# Patient Record
Sex: Male | Born: 1960 | Race: White | Hispanic: No | Marital: Married | Smoking: Former smoker
Health system: Southern US, Community
[De-identification: ages and names within clinical notes are randomized; demographics above are authoritative.]

## PROBLEM LIST (undated history)

## (undated) DIAGNOSIS — E079 Disorder of thyroid, unspecified: Secondary | ICD-10-CM

## (undated) DIAGNOSIS — C801 Malignant (primary) neoplasm, unspecified: Secondary | ICD-10-CM

## (undated) DIAGNOSIS — I10 Essential (primary) hypertension: Secondary | ICD-10-CM

## (undated) HISTORY — DX: Disorder of thyroid, unspecified: E07.9

---

## 1998-06-27 ENCOUNTER — Ambulatory Visit (HOSPITAL_COMMUNITY): Admission: RE | Admit: 1998-06-27 | Discharge: 1998-06-27 | Payer: Self-pay | Admitting: Family Medicine

## 2009-04-03 ENCOUNTER — Ambulatory Visit: Payer: Self-pay | Admitting: Sports Medicine

## 2009-04-03 DIAGNOSIS — M7660 Achilles tendinitis, unspecified leg: Secondary | ICD-10-CM

## 2009-04-03 DIAGNOSIS — S8410XA Injury of peroneal nerve at lower leg level, unspecified leg, initial encounter: Secondary | ICD-10-CM

## 2009-07-30 ENCOUNTER — Ambulatory Visit: Payer: Self-pay | Admitting: Sports Medicine

## 2010-06-11 NOTE — Assessment & Plan Note (Signed)
Summary: F/U,MC   Vital Signs:  Patient profile:   50 year old male BP sitting:   163 / 88  Vitals Entered By: Lillia Pauls CMA (July 30, 2009 9:39 AM)  History of Present Illness: AT is doing better on RT ran 14.5 miles yesterday some apin immediately after but gone by this AM no swelling feels good in sports insoles with heel lift on RT  on left was getting 5th toe and MT pain lat post resolved this  Physical Exam  General:  Well-developed,well-nourished,in no acute distress; alert,appropriate and cooperative throughout examination Msk:  RT AT minimally tender not swollen  left AT is normal  foot only mild breakdown  mod to high arch  running gait shows forefoot to midfoot strike at slower paces form is good overall and no limp today   Impression & Recommendations:  Problem # 1:  ACHILLES TENDINITIS (ICD-726.71)  This is better  keep up exercises given new pair of sports insoles to help with marathon prep heel lift added on RT  reck if not resolbved after training dec  Orders: Sports Insoles [phone])  Problem # 2:  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

## 2011-10-29 ENCOUNTER — Ambulatory Visit (INDEPENDENT_AMBULATORY_CARE_PROVIDER_SITE_OTHER): Payer: BC Managed Care – PPO | Admitting: Sports Medicine

## 2011-10-29 VITALS — BP 140/78 | Ht 70.0 in | Wt 160.0 lb

## 2011-10-29 DIAGNOSIS — IMO0002 Reserved for concepts with insufficient information to code with codable children: Secondary | ICD-10-CM

## 2011-10-29 DIAGNOSIS — S76319A Strain of muscle, fascia and tendon of the posterior muscle group at thigh level, unspecified thigh, initial encounter: Secondary | ICD-10-CM | POA: Insufficient documentation

## 2011-10-29 DIAGNOSIS — M722 Plantar fascial fibromatosis: Secondary | ICD-10-CM | POA: Insufficient documentation

## 2011-10-29 MED ORDER — MELOXICAM 15 MG PO TABS: ORAL_TABLET | ORAL | Status: DC

## 2011-10-29 NOTE — Assessment & Plan Note (Signed)
Thigh sleeve. HS rehab. Meloxicam. RTC 6 weeks.

## 2011-10-29 NOTE — Progress Notes (Signed)
Patient ID: Gabriel Delgado, male   DOB: Nov 04, 1960, 51 y.o.   MRN: [id number] Subjective:   FA:OZHY hamstring, left foot.  HPI: Left hamstring, pain is been present for several months. Localized mid semimembranosus. No specific rehabilitation, injuries, or medication used for this. Pain is localized as above, no radiation, sharp in nature particularly when firing the hamstrings.  Left heel pain: Present for 4 weeks, worse in the morning, no radiation, no specific rehabilitation done for this. Worse with weightbearing, and with running.  Currently at 40 miles per week.  Past medical history, surgical history, family history, social history, allergies, and medications reviewed from the medical record and no changes needed.  Review of Systems: No fevers, chills, night sweats, weight loss, chest pain, or shortness of breath.    Objective:  General:  Well Developed, well nourished, and in no acute distress. Neuro:  Alert and oriented x3, extra-ocular muscles intact. Skin: Warm and dry, no rashes noted. Respiratory:  Not using accessory muscles, speaking in full sentences. Musculoskeletal: To palpation mid semimembranosus and the left side. Reproduction of pain with resisted knee flexion.  Tender to palpation left calcaneal insertion of the plantar fascia. Pes cavus bilaterally. Leg lengths even, hip abductor strength even.  Sports insoles with heel cups placed. Running gait is neutral.  Assessment & Plan:

## 2011-10-29 NOTE — Assessment & Plan Note (Signed)
Sports insoles. Heel cups. HEP. Meloxicam. RTC 6 weeks, injection prn.

## 2011-12-10 ENCOUNTER — Ambulatory Visit (INDEPENDENT_AMBULATORY_CARE_PROVIDER_SITE_OTHER): Payer: BC Managed Care – PPO | Admitting: Sports Medicine

## 2011-12-10 VITALS — BP 149/85

## 2011-12-10 DIAGNOSIS — M79673 Pain in unspecified foot: Secondary | ICD-10-CM | POA: Insufficient documentation

## 2011-12-10 DIAGNOSIS — M79609 Pain in unspecified limb: Secondary | ICD-10-CM

## 2011-12-10 DIAGNOSIS — M722 Plantar fascial fibromatosis: Secondary | ICD-10-CM

## 2011-12-10 NOTE — Progress Notes (Signed)
The patient is here for followup of his plantar fasciitis. This is on his left foot. Patient states that it has improved but he still has significant pain after a long runs. Patient is able to do his 2-3 miles during the week when he tries to go on greater than 5 miles he has significant pain at the end of the run. Patient states that it's more in the midfoot hindfoot. Patient denies having any swelling or numbness in the area. Patient is taking the anti-inflammatories we gave him previously. Patient is also wearing a sports insoles which he does think has helped. Patient though would only say he is about 40-50% better.  Review of systems as related to the chief complaint is negative.  Physical exam: Normal inspection with no visable or palpable fat pad atrophy and no visible swelling/erythema. Patient does have high longitudinal arches bilaterally. Patient is not tender at medial insertion of plantar fascia into calcaneus. Patient is significantly more tender over the midfoot. Great toe motion: Full Patient's transverse arches are normal.  Ultrasound today showed that patient does have thickening of his plantar fasciitis at the insertion of the calcaneus measured at 0.60 cm. Patient also has a nodule of 0.38 approximately 2 cm distally from the insertion. Patient is most tender in the midfoot right over what appears to be a varicose vein that is significant length and appears to go potentially within the muscle belly. No signs of infection no signs of fracture.

## 2011-12-10 NOTE — Patient Instructions (Signed)
Good to see you Wear the arch supports Put the scaphoid pads in you other shoes Come back in 3 weeks with appointment with Dr. Katrinka Blazing to make orthotics.

## 2011-12-10 NOTE — Assessment & Plan Note (Addendum)
Patient still has some thickening on ultrasound but is not having any pain. Patient appears to be doing better overall with the plantar fasciitis but found to have a potential varicose vein that is contributing to his running foot pain. Encourage patient to continue to do exercises but he can stop the meloxicam at this point as we do not see much inflammation. Patient also given scaphoid pad and arch strap to help support the arch. In addition to that patient was given arch straps. Patient will continue to do this will followup in 3 weeks and if show improvement we'll have custom orthotics made.

## 2011-12-10 NOTE — Assessment & Plan Note (Signed)
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 Patient's high arch is likely contributing to this as well as plantar fasciitis. We will support the arch with a scaphoid pad today and he was given extra is to put in his other shoes. Patient also given arch trap to try. Patient will followup in 3 weeks' time when he will have custom orthotics made he'll help with this problem. Patient will discontinue his meloxicam. Patient will come back in 3 weeks.

## 2011-12-30 ENCOUNTER — Ambulatory Visit (INDEPENDENT_AMBULATORY_CARE_PROVIDER_SITE_OTHER): Payer: BC Managed Care – PPO | Admitting: Family Medicine

## 2011-12-30 ENCOUNTER — Encounter: Payer: Self-pay | Admitting: Family Medicine

## 2011-12-30 VITALS — BP 133/84 | HR 49 | Ht 69.0 in | Wt 160.0 lb

## 2011-12-30 DIAGNOSIS — M79673 Pain in unspecified foot: Secondary | ICD-10-CM

## 2011-12-30 DIAGNOSIS — M722 Plantar fascial fibromatosis: Secondary | ICD-10-CM

## 2011-12-30 DIAGNOSIS — M79609 Pain in unspecified limb: Secondary | ICD-10-CM

## 2011-12-30 NOTE — Assessment & Plan Note (Signed)
Patient then was placed in orthotics today. Patient is going to try these custom orthotics and followup as needed. Patient is a can return at any point for any changes that would be necessary. Patient was able to ambulate and run in a very neutral position without significant pain. Encourage patient to increase his activity slowly in knees orthotics and likely will adjust to him over the course of the next 2 weeks.

## 2011-12-30 NOTE — Progress Notes (Signed)
Patient is here for followup of his left foot pain. Patient was treated previously for plantar fasciitis and was found to have a varicose vein that transverse the plantar aspect of his foot musculature that was inflamed.patient has been wearing sports insoles and has had some minimal improvement since that time. Patient is here for custom orthotics which hopefully will be of greater benefit. Patient is continuing with icing regimen but has not been using any anti-inflammatories at this time. Patient has been able to run still running up to 8 miles at one time. Patient is planning on hopefully running 50 miles this weekend.  Review of systems as related to the chief complaint is negative.  Physical exam: Normal inspection with no visable or palpable fat pad atrophy and no visible swelling/erythema. Patient does have high longitudinal arches bilaterally. Patient is not tender at medial insertion of plantar fascia into calcaneus. Patient is significantly more tender over the midfoot. Great toe motion: Full Patient's transverse arches are normal.  Patient was fitted for a : standard, cushioned, semi-rigid orthotic. The orthotic was heated and afterward the patient stood on the orthotic blank positioned on the orthotic stand. The patient was positioned in subtalar neutral position and 10 degrees of ankle dorsiflexion in a weight bearing stance. After completion of molding, a stable base was applied to the orthotic blank. The blank was ground to a stable position for weight bearing. Size:11 Base:EVA Posting:none Additional orthotic padding:none

## 2012-01-01 ENCOUNTER — Telehealth: Payer: Self-pay | Admitting: *Deleted

## 2012-01-01 NOTE — Telephone Encounter (Signed)
Pt called stating he ran 6 miles without new orthotics on tues and had "excruiating" pain after run- was unable to bear weight.  Still very painful after 2 days of rest.  Per Dr. Margaretha Sheffield scheduled pt for appt with Dr. Jennette Kettle tomorrow.

## 2012-01-02 ENCOUNTER — Ambulatory Visit (INDEPENDENT_AMBULATORY_CARE_PROVIDER_SITE_OTHER): Payer: BC Managed Care – PPO | Admitting: Family Medicine

## 2012-01-02 ENCOUNTER — Encounter: Payer: Self-pay | Admitting: Family Medicine

## 2012-01-02 VITALS — BP 153/88 | HR 56 | Ht 70.0 in | Wt 160.0 lb

## 2012-01-02 DIAGNOSIS — M722 Plantar fascial fibromatosis: Secondary | ICD-10-CM

## 2012-01-02 MED ORDER — NITROGLYCERIN 0.2 MG/HR TD PT24: MEDICATED_PATCH | TRANSDERMAL | Status: DC

## 2012-01-02 NOTE — Patient Instructions (Signed)
DR DAVID STAUFFER MON OCT 7TH AT 2PM [address] [place] [address] [phone] PT IS ON WAITING LIST TO BE CALLED WITH CANCELLATIONS OCCUR

## 2012-01-02 NOTE — Progress Notes (Signed)
Patient ID: Gabriel Delgado, male   DOB: 31-May-1960, 51 y.o.   MRN: [id number] Patient here for worsening of his left plan fasciitis. He had seen Dr. Katrinka Blazing last week he may be impaired orthotics. The patient ran 6 miles without the orthotics because he had been told to break them in generally. After the 6 mile run he had severe pain which has continued until today. He has not been able to run since. He weight bears with extreme pain. Has not noted any redness or swelling in he said no fever.        OBJECTIVE: Origin the plantar fascia on the left foot is tender to palpation. He is quite prominent plantar fascia bilaterally. Imaging review: Elective his ultrasound images. 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 There is some disarray of the fibers and they appeared to taking a lot of somewhat circular pattern. ASSESSMENT/ PLAN: Left plan fasciitis that I think maybe compounded by the development of a fibroma within the plantar fascia.. If there truly is a fibroma there,  then his prognosis is not as good;  we discussed at some length today. I would like to get evaluation of podiatrist and have scheduled him to see Dr. San Jetty. We can't get him in there for about a month. In the interim we will try for some pain relief with topical nitro glycerin patch. I would be hesitant to do a corticosteroid injection at this time until I get further evaluation. He is comfortable with this plan. Should he have worsening symptoms in the interim he'll let me know.

## 2012-08-13 ENCOUNTER — Other Ambulatory Visit: Payer: Self-pay | Admitting: Emergency Medicine

## 2012-08-13 DIAGNOSIS — R0989 Other specified symptoms and signs involving the circulatory and respiratory systems: Secondary | ICD-10-CM

## 2012-08-18 ENCOUNTER — Ambulatory Visit
Admission: RE | Admit: 2012-08-18 | Discharge: 2012-08-18 | Disposition: A | Discharge status: Home / Self Care | Payer: BC Managed Care – PPO | Source: Ambulatory Visit | Attending: Emergency Medicine | Admitting: Emergency Medicine

## 2012-08-18 DIAGNOSIS — R0989 Other specified symptoms and signs involving the circulatory and respiratory systems: Secondary | ICD-10-CM

## 2012-08-20 ENCOUNTER — Other Ambulatory Visit: Payer: Self-pay | Admitting: Emergency Medicine

## 2012-08-20 ENCOUNTER — Ambulatory Visit
Admission: RE | Admit: 2012-08-20 | Discharge: 2012-08-20 | Disposition: A | Discharge status: Home / Self Care | Payer: BC Managed Care – PPO | Source: Ambulatory Visit | Attending: Emergency Medicine | Admitting: Emergency Medicine

## 2012-08-20 DIAGNOSIS — R109 Unspecified abdominal pain: Secondary | ICD-10-CM

## 2012-08-20 MED ORDER — IOHEXOL 300 MG/ML  SOLN
100.0000 mL | Freq: Once | INTRAMUSCULAR | Status: AC | PRN
  Administered 2012-08-20: 100 mL via INTRAVENOUS

## 2013-02-03 ENCOUNTER — Encounter: Payer: Self-pay | Admitting: Family Medicine

## 2013-09-27 ENCOUNTER — Encounter: Payer: Self-pay | Admitting: Internal Medicine

## 2013-11-09 ENCOUNTER — Ambulatory Visit (AMBULATORY_SURGERY_CENTER): Payer: Self-pay | Admitting: *Deleted

## 2013-11-09 VITALS — Ht 71.0 in | Wt 175.0 lb

## 2013-11-09 DIAGNOSIS — Z1211 Encounter for screening for malignant neoplasm of colon: Secondary | ICD-10-CM

## 2013-11-09 MED ORDER — MOVIPREP 100 G PO SOLR: ORAL | Status: DC

## 2013-11-09 NOTE — Progress Notes (Signed)
No egg or soy allergy  No anesthesia or intubation problems per pt  No diet medications taken  Registered in EMMI   

## 2013-11-10 ENCOUNTER — Encounter: Payer: Self-pay | Admitting: Internal Medicine

## 2013-11-16 ENCOUNTER — Encounter: Payer: BC Managed Care – PPO | Admitting: Internal Medicine

## 2013-12-21 ENCOUNTER — Ambulatory Visit (AMBULATORY_SURGERY_CENTER): Payer: BC Managed Care – PPO | Admitting: Internal Medicine

## 2013-12-21 ENCOUNTER — Encounter: Payer: Self-pay | Admitting: Internal Medicine

## 2013-12-21 VITALS — BP 108/74 | HR 56 | Temp 98.2°F | Resp 19 | Ht 71.0 in | Wt 175.0 lb

## 2013-12-21 DIAGNOSIS — D126 Benign neoplasm of colon, unspecified: Secondary | ICD-10-CM

## 2013-12-21 DIAGNOSIS — Z1211 Encounter for screening for malignant neoplasm of colon: Secondary | ICD-10-CM

## 2013-12-21 MED ORDER — SODIUM CHLORIDE 0.9 % IV SOLN: 500.0000 mL | INTRAVENOUS | Status: DC

## 2013-12-21 NOTE — Op Note (Signed)
[facility]  [address] [place], [address]   COLONOSCOPY PROCEDURE REPORT  PATIENT: Gabriel Delgado, Gabriel Delgado.  MR#: [id number] BIRTHDATE: Oct 08, 1960 , 52  yrs. old GENDER: Male ENDOSCOPIST: Jerene Bears, MD REFERRED TK:PTWSF Kindl, M.D. PROCEDURE DATE:  12/21/2013 PROCEDURE:   Colonoscopy with snare polypectomy First Screening Colonoscopy - Avg.  risk and is 50 yrs.  old or older Yes.  Prior Negative Screening - Now for repeat screening. N/A  History of Adenoma - Now for follow-up colonoscopy & has been > or = to 3 yrs.  N/A  Polyps Removed Today? Yes. ASA CLASS:   Class II INDICATIONS:average risk screening and first colonoscopy. MEDICATIONS: MAC sedation, administered by CRNA, Propofol (Diprivan), and propofol (Diprivan) 500mg  IV  DESCRIPTION OF PROCEDURE:   After the risks benefits and alternatives of the procedure were thoroughly explained, informed consent was obtained.  A digital rectal exam revealed no rectal mass.   The LB [id number]  endoscope was introduced through the anus and advanced to the cecum, which was identified by both the appendix and ileocecal valve. No adverse events experienced. The quality of the prep was good, using MoviPrep  The instrument was then slowly withdrawn as the colon was fully examined.  COLON FINDINGS: A semi-pedunculated polyp measuring 6 mm in size was found in the sigmoid colon.  A polypectomy was performed with a cold snare.  The resection was complete and the polyp tissue was completely retrieved.   There was moderate diverticulosis noted in the transverse colon, descending colon, and sigmoid colon with associated muscular hypertrophy.  Retroflexed views revealed internal hemorrhoids. The time to cecum=4 minutes 23 seconds. Withdrawal time=13 minutes 17 seconds.  The scope was withdrawn and the procedure completed. COMPLICATIONS: There were no complications.  ENDOSCOPIC IMPRESSION: 1.   Semi-pedunculated polyp  measuring 6 mm in size was found in the sigmoid colon; polypectomy was performed with a cold snare 2.   There was moderate diverticulosis noted in the transverse colon, descending colon, and sigmoid colon  RECOMMENDATIONS: 1.  Hold aspirin, aspirin products, and anti-inflammatory medication for 1 week. 2.  Await pathology results 3.  High fiber diet 4.  If the polyp removed today is proven to be an adenomatous (pre-cancerous) polyp, you will need a repeat colonoscopy in 5 years.  Otherwise you should continue to follow colorectal cancer screening guidelines for "routine risk" patients with colonoscopy in 10 years.  You will receive a letter within 1-2 weeks with the results of your biopsy as well as final recommendations.  Please call my office if you have not received a letter after 3 weeks.   eSigned:  Jerene Bears, MD 12/21/2013 10:55 AM   cc: The Patient and Ihor Gully, MD   PATIENT NAME:  Gabriel Delgado, Gabriel Delgado. MR#: [id number]

## 2013-12-21 NOTE — Progress Notes (Signed)
Called to room to assist during endoscopic procedure.  Patient ID and intended procedure confirmed with present staff. Received instructions for my participation in the procedure from the performing physician.  

## 2013-12-21 NOTE — Progress Notes (Signed)
Report to PACU, RN, vss, BBS= Clear.  

## 2013-12-21 NOTE — Patient Instructions (Signed)
Discharge instructions given with verbal understanding. Handouts on polyps and diverticulosis. Resume previous medications. YOU HAD AN ENDOSCOPIC PROCEDURE TODAY AT THE [facility]: Refer to the procedure report that was given to you for any specific questions about what was found during the examination.  If the procedure report does not answer your questions, please call your gastroenterologist to clarify.  If you requested that your care partner not be given the details of your procedure findings, then the procedure report has been included in a sealed envelope for you to review at your convenience later.  YOU SHOULD EXPECT: Some feelings of bloating in the abdomen. Passage of more gas than usual.  Walking can help get rid of the air that was put into your GI tract during the procedure and reduce the bloating. If you had a lower endoscopy (such as a colonoscopy or flexible sigmoidoscopy) you may notice spotting of blood in your stool or on the toilet paper. If you underwent a bowel prep for your procedure, then you may not have a normal bowel movement for a few days.  DIET: Your first meal following the procedure should be a light meal and then it is ok to progress to your normal diet.  A half-sandwich or bowl of soup is an example of a good first meal.  Heavy or fried foods are harder to digest and may make you feel nauseous or bloated.  Likewise meals heavy in dairy and vegetables can cause extra gas to form and this can also increase the bloating.  Drink plenty of fluids but you should avoid alcoholic beverages for 24 hours.  ACTIVITY: Your care partner should take you home directly after the procedure.  You should plan to take it easy, moving slowly for the rest of the day.  You can resume normal activity the day after the procedure however you should NOT DRIVE or use heavy machinery for 24 hours (because of the sedation medicines used during the test).    SYMPTOMS TO REPORT  IMMEDIATELY: A gastroenterologist can be reached at any hour.  During normal business hours, 8:30 AM to 5:00 PM Monday through Friday, call [phone].  After hours and on weekends, please call the GI answering service at [phone] who will take a message and have the physician on call contact you.   Following lower endoscopy (colonoscopy or flexible sigmoidoscopy):  Excessive amounts of blood in the stool  Significant tenderness or worsening of abdominal pains  Swelling of the abdomen that is new, acute  Fever of 100F or higher  FOLLOW UP: If any biopsies were taken you will be contacted by phone or by letter within the next 1-3 weeks.  Call your gastroenterologist if you have not heard about the biopsies in 3 weeks.  Our staff will call the home number listed on your records the next business day following your procedure to check on you and address any questions or concerns that you may have at that time regarding the information given to you following your procedure. This is a courtesy call and so if there is no answer at the home number and we have not heard from you through the emergency physician on call, we will assume that you have returned to your regular daily activities without incident.  SIGNATURES/CONFIDENTIALITY: You and/or your care partner have signed paperwork which will be entered into your electronic medical record.  These signatures attest to the fact that that the information above on your After Visit Summary   has been reviewed and is understood.  Full responsibility of the confidentiality of this discharge information lies with you and/or your care-partner. 

## 2013-12-23 ENCOUNTER — Telehealth: Payer: Self-pay | Admitting: *Deleted

## 2013-12-23 NOTE — Telephone Encounter (Signed)
  Follow up Call-  Call back number 12/21/2013  Post procedure Call Back phone  # [phone]  Permission to leave phone message Yes     Patient questions:  Do you have a fever, pain , or abdominal swelling? No. Pain Score  0 *  Have you tolerated food without any problems? Yes.    Have you been able to return to your normal activities? Yes.    Do you have any questions about your discharge instructions: Diet   No. Medications  No. Follow up visit  No.  Do you have questions or concerns about your Care? No.  Actions: * If pain score is 4 or above: No action needed, pain <4.

## 2013-12-26 ENCOUNTER — Encounter: Payer: Self-pay | Admitting: Internal Medicine

## 2015-03-01 ENCOUNTER — Encounter (HOSPITAL_COMMUNITY): Payer: Self-pay | Admitting: Emergency Medicine

## 2015-03-01 ENCOUNTER — Emergency Department (HOSPITAL_COMMUNITY)
Admission: EM | Admit: 2015-03-01 | Discharge: 2015-03-01 | Disposition: A | Discharge status: Home / Self Care | Payer: BLUE CROSS/BLUE SHIELD | Attending: Emergency Medicine | Admitting: Emergency Medicine

## 2015-03-01 ENCOUNTER — Other Ambulatory Visit (HOSPITAL_COMMUNITY): Payer: Self-pay | Admitting: Family Medicine

## 2015-03-01 ENCOUNTER — Ambulatory Visit (EMERGENCY_DEPARTMENT_HOSPITAL)
Admission: RE | Admit: 2015-03-01 | Discharge: 2015-03-01 | Disposition: A | Discharge status: Home / Self Care | Payer: BLUE CROSS/BLUE SHIELD | Source: Ambulatory Visit | Attending: Family Medicine | Admitting: Family Medicine

## 2015-03-01 DIAGNOSIS — R52 Pain, unspecified: Secondary | ICD-10-CM | POA: Diagnosis not present

## 2015-03-01 DIAGNOSIS — I82401 Acute embolism and thrombosis of unspecified deep veins of right lower extremity: Secondary | ICD-10-CM | POA: Insufficient documentation

## 2015-03-01 DIAGNOSIS — I82441 Acute embolism and thrombosis of right tibial vein: Secondary | ICD-10-CM

## 2015-03-01 LAB — COMPREHENSIVE METABOLIC PANEL
ALT: 29 U/L (ref 17–63)
AST: 32 U/L (ref 15–41)
Albumin: 4.1 g/dL (ref 3.5–5.0)
Alkaline Phosphatase: 60 U/L (ref 38–126)
Anion gap: 8 (ref 5–15)
BUN: 16 mg/dL (ref 6–20)
CO2: 28 mmol/L (ref 22–32)
Calcium: 9.3 mg/dL (ref 8.9–10.3)
Chloride: 101 mmol/L (ref 101–111)
Creatinine, Ser: 0.95 mg/dL (ref 0.61–1.24)
GFR calc Af Amer: >60 mL/min (ref >60)
GFR calc non Af Amer: >60 mL/min (ref >60)
Glucose, Bld: 95 mg/dL (ref 65–99)
Potassium: 4 mmol/L (ref 3.5–5.1)
Sodium: 137 mmol/L (ref 135–145)
Total Bilirubin: 0.4 mg/dL (ref 0.3–1.2)
Total Protein: 7.4 g/dL (ref 6.5–8.1)

## 2015-03-01 LAB — CBC WITH DIFFERENTIAL/PLATELET
Basophils Absolute: 0 10*3/uL (ref 0.0–0.1)
Basophils Relative: 1 %
Eosinophils Absolute: 0.4 10*3/uL (ref 0.0–0.7)
Eosinophils Relative: 6 %
HCT: 46 % (ref 39.0–52.0)
Hemoglobin: 15.4 g/dL (ref 13.0–17.0)
Lymphocytes Relative: 30 %
Lymphs Abs: 2 10*3/uL (ref 0.7–4.0)
MCH: 31 pg (ref 26.0–34.0)
MCHC: 33.5 g/dL (ref 30.0–36.0)
MCV: 92.7 fL (ref 78.0–100.0)
Monocytes Absolute: 0.6 10*3/uL (ref 0.1–1.0)
Monocytes Relative: 9 %
Neutro Abs: 3.7 10*3/uL (ref 1.7–7.7)
Neutrophils Relative %: 54 %
Platelets: 234 10*3/uL (ref 150–400)
RBC: 4.96 MIL/uL (ref 4.22–5.81)
RDW: 12.9 % (ref 11.5–15.5)
WBC: 6.7 10*3/uL (ref 4.0–10.5)

## 2015-03-01 LAB — URINALYSIS, ROUTINE W REFLEX MICROSCOPIC
Bilirubin Urine: NEGATIVE
Glucose, UA: NEGATIVE mg/dL
Hgb urine dipstick: NEGATIVE
Ketones, ur: NEGATIVE mg/dL
Leukocytes, UA: NEGATIVE
Nitrite: NEGATIVE
Protein, ur: NEGATIVE mg/dL
Specific Gravity, Urine: 1.018 (ref 1.005–1.030)
Urobilinogen, UA: 0.2 mg/dL (ref 0.0–1.0)
pH: 5 (ref 5.0–8.0)

## 2015-03-01 LAB — PROTIME-INR
INR: 1.06 (ref 0.00–1.49)
Prothrombin Time: 14 seconds (ref 11.6–15.2)

## 2015-03-01 MED ORDER — RIVAROXABAN 15 MG PO TABS
15.0000 mg | ORAL_TABLET | Freq: Two times a day (BID) | ORAL | Status: DC
  Administered 2015-03-01: 15 mg via ORAL
  Filled 2015-03-01 (×2): qty 1

## 2015-03-01 MED ORDER — RIVAROXABAN 15 MG PO TABS: 15.0000 mg | ORAL_TABLET | Freq: Two times a day (BID) | ORAL | Status: AC

## 2015-03-01 MED ORDER — RIVAROXABAN 15 MG PO TABS: 15.0000 mg | ORAL_TABLET | Freq: Two times a day (BID) | ORAL | Status: DC

## 2015-03-01 MED ORDER — RIVAROXABAN (XARELTO) EDUCATION KIT FOR DVT/PE PATIENTS
PACK | Freq: Once | Status: DC
  Filled 2015-03-01: qty 1

## 2015-03-01 MED ORDER — RIVAROXABAN 20 MG PO TABS: 20.0000 mg | ORAL_TABLET | Freq: Every day | ORAL | Status: AC

## 2015-03-01 MED ORDER — RIVAROXABAN 20 MG PO TABS: 20.0000 mg | ORAL_TABLET | Freq: Every day | ORAL | Status: DC

## 2015-03-01 NOTE — ED Provider Notes (Signed)
CSN: [id number]     Arrival date & time 03/01/15  1615 History  By signing my name below, I, Irene Pap, attest that this documentation has been prepared under the direction and in the presence of [employer], PA-C. Electronically Signed: Irene Pap, ED Scribe. 03/01/2015. 10:24 PM.   Chief Complaint  Patient presents with  . DVT   The history is provided by the patient. No language interpreter was used.   HPI Comments: KALLIN HENK is a 54 y.o. Male with a hx of thyroid disease who presents to the Emergency Department complaining of right calf pain and DVT follow up one hour ago. Pt states that he ran a half marathon this past weekend which caused right calf pain and swelling. He states that he saw his orthopedic doctor, Dr. Rip Harbour, who sent him to the ED for a DVT work up and his study came back positive. He denies fever, chills, cough, chest pain or SOB. He denies hx of DVT, hematological problems, or cancer. Pt states that he recently ran 68 miles prior to his marathon and recently traveled to [place].   Past Medical History  Diagnosis Date  . Thyroid disease     hypothryoid   History reviewed. No pertinent past surgical history. Family History  Problem Relation Age of Onset  . Colon cancer Neg Hx   . Esophageal cancer Neg Hx   . Rectal cancer Neg Hx   . Stomach cancer Neg Hx   . Colon polyps Father    Social History  Substance Use Topics  . Smoking status: Former Smoker    Quit date: 05/12/1997  . Smokeless tobacco: Never Used  . Alcohol Use: Yes     Comment: few beers on weekend    Review of Systems  All other systems reviewed and are negative.  Allergies  Review of patient's allergies indicates no known allergies.  Home Medications   Prior to Admission medications   Medication Sig Start Date End Date Taking? Authorizing Provider  ALPRAZolam Duanne Moron) 0.5 MG tablet Take 0.5 mg by mouth as needed. 12/23/11   Historical Provider, MD  Brimonidine  Tartrate (MIRVASO) 0.33 % GEL Apply topically daily.    Historical Provider, MD  levothyroxine (SYNTHROID, LEVOTHROID) 50 MCG tablet Take 50 mcg by mouth daily before breakfast.    Historical Provider, MD  metroNIDAZOLE (METROGEL) 1 % gel Apply topically daily.    Historical Provider, MD  Rivaroxaban (XARELTO) 15 MG TABS tablet Take 1 tablet (15 mg total) by mouth 2 (two) times daily. 03/01/15   Okey Regal, PA-C  rivaroxaban (XARELTO) 20 MG TABS tablet Take 1 tablet (20 mg total) by mouth daily with supper. 03/23/15   Apolonia Ellwood, PA-C   BP 169/93 mmHg  Pulse 65  Temp(Src) 98.2 F (36.8 C) (Oral)  Resp 20  SpO2 97%  Physical Exam  Constitutional: He is oriented to person, place, and time. He appears well-developed and well-nourished.  HENT:  Head: Normocephalic and atraumatic.  Right Ear: External ear normal.  Left Ear: External ear normal.  Nose: Nose normal.  Mouth/Throat: Oropharynx is clear and moist. No oropharyngeal exudate.  Eyes: Conjunctivae and EOM are normal. Pupils are equal, round, and reactive to light.  Neck: Normal range of motion. Neck supple.  Cardiovascular: Normal rate, regular rhythm, normal heart sounds and intact distal pulses.  Exam reveals no gallop and no friction rub.   No murmur heard. Pulmonary/Chest: Effort normal and breath sounds normal. No respiratory distress. He has no  wheezes. He has no rales. He exhibits no tenderness.  Musculoskeletal: Normal range of motion.  Extremities equal bilaterally; no signs of swelling or edema; minor tenderness to the right calf; pedal pulses 2+  Neurological: He is alert and oriented to person, place, and time. He has normal reflexes. He displays normal reflexes. No cranial nerve deficit. He exhibits normal muscle tone. Coordination normal.  Skin: Skin is warm and dry.  Psychiatric: He has a normal mood and affect. His behavior is normal.  Nursing note and vitals reviewed.  ED Course  Procedures (including  critical care time) DIAGNOSTIC STUDIES: Oxygen Saturation is 97% on RA, normal by my interpretation.    COORDINATION OF CARE: 5:17 PM-Discussed treatment plan which includes labs, UA, anti-coagulant and follow up precautions with pt at bedside and pt agreed to plan.   Labs Review Labs Reviewed  CBC WITH DIFFERENTIAL/PLATELET  PROTIME-INR  COMPREHENSIVE METABOLIC PANEL  URINALYSIS, ROUTINE W REFLEX MICROSCOPIC (NOT AT [facility])   Imaging Review No results found. I have personally reviewed and evaluated these images and lab results as part of my medical decision-making.   EKG Interpretation None      MDM   Final diagnoses:  Deep vein thrombosis (DVT) of tibial vein of right lower extremity, unspecified chronicity (HCC)   Labs:  Labs Reviewed  CBC WITH DIFFERENTIAL/PLATELET  PROTIME-INR  COMPREHENSIVE METABOLIC PANEL  URINALYSIS, ROUTINE W REFLEX MICROSCOPIC (NOT AT [facility])    Imaging:  Consults:  Therapeutics: Xarelto- 15 mg  Discharge Meds: Xarelto  Assessment/Plan: 54 y.o. Male who presents to the ED after resulting with a positive DVT study. Pt was sent to the ED by his orthopedic doctor to have the DVT work up after presenting with right calf pain and swelling from running a half marathon this past weekend. Will perform blood work and UA. Discussed treatment options with pt which includes the use of anti-coagulants and how to proceed with treatment with PCP. Will have pharmacy discuss anti-coagulant options with pt. after discussing risks and benefits with the patient patient agreed to anticoagulation therapy. Patient received the first dose here in the ED, with instructions for continuing management. Patient is instructed follow-up with his primary care on Monday for reevaluation and discussion of duration of therapy. Patient was given strict return precautions by both myself and the pharmacist. He verbalizes understanding and agreement for today's plan and had no further  questions or concerns at time of discharge.  I personally performed the services described in this documentation, which was scribed in my presence. The recorded information has been reviewed and is accurate.   Okey Regal, PA-C 03/01/15 [place], MD 03/01/15 [phone]

## 2015-03-01 NOTE — Progress Notes (Signed)
ANTICOAGULATION CONSULT NOTE - Initial Consult  Pharmacy Consult for Xarelto Indication: DVT  No Known Allergies  Patient Measurements: Height: 5\' 10"  (177.8 cm) (pt reported) Weight: 173 lb (78.472 kg) (pt reported) IBW/kg (Calculated) : 73  Vital Signs: Temp: 98.2 F (36.8 C) (10/20 1633) Temp Source: Oral (10/20 1633) BP: 169/93 mmHg (10/20 1633) Pulse Rate: 65 (10/20 1633)  Labs:  Recent Labs  03/01/15 1745  HGB 15.4  HCT 46.0  PLT 234  LABPROT 14.0  INR 1.06  CREATININE 0.95    Estimated Creatinine Clearance: 91.8 mL/min (by C-G formula based on Cr of 0.95).   Medical History: Past Medical History  Diagnosis Date  . Thyroid disease     hypothryoid    Medications:   (Not in a hospital admission)  Assessment: 55 yoM w/ DVT of right leg after running marathon this past weekend. PMH of hypothyroidism. To be initiated on Xarelto per pharmacy.  Hgb 15.4, Plt 234, SCr 0.95, CrCl ~92  Goal of Therapy:  Therapeutic anticoagulation Monitor platelets by anticoagulation protocol: Yes   Plan:  Xarelto 15 mg BID for 21 days, then 20 mg daily  F/u with PCP who will determine length of treatment Patient and wife educated on 03/01/15.  Governor Specking, PharmD Clinical Pharmacy Resident Pager: [phone] 03/01/2015,7:43 PM

## 2015-03-01 NOTE — Progress Notes (Signed)
*  Preliminary Results* Right lower extremity venous duplex completed. Right lower extremity is positive for deep vein thrombosis involving a small segment of a single right posterior tibial vein. There is no evidence of right Baker's cyst.  03/01/2015 4:02 PM  Maudry Mayhew, RVT, RDCS, RDMS

## 2015-03-01 NOTE — ED Notes (Signed)
Pt c/o R calf pain since running marathon on Saturday. Sent here today for dvt study which was positive. They then sent him here. Pt denies cp/sob.

## 2015-03-01 NOTE — Discharge Instructions (Signed)
Deep Vein Thrombosis °A deep vein thrombosis (DVT) is a blood clot (thrombus) that usually occurs in a deep, larger vein of the lower leg or the pelvis, or in an upper extremity such as the arm. These are dangerous and can lead to serious and even life-threatening complications if the clot travels to the lungs. °A DVT can damage the valves in your leg veins so that instead of flowing upward, the blood pools in the lower leg. This is called post-thrombotic syndrome, and it can result in pain, swelling, discoloration, and sores on the leg. °CAUSES °A DVT is caused by the formation of a blood clot in your leg, pelvis, or arm. Usually, several things contribute to the formation of blood clots. A clot may develop when: °· Your blood flow slows down. °· Your vein becomes damaged in some way. °· You have a condition that makes your blood clot more easily. °RISK FACTORS °A DVT is more likely to develop in: °· People who are older, especially over 60 years of age. °· People who are overweight (obese). °· People who sit or lie still for a long time, such as during long-distance travel (over 4 hours), bed rest, hospitalization, or during recovery from certain medical conditions like a stroke. °· People who do not engage in much physical activity (sedentary lifestyle). °· People who have chronic breathing disorders. °· People who have a personal or family history of blood clots or blood clotting disease. °· People who have peripheral vascular disease (PVD), diabetes, or some types of cancer. °· People who have heart disease, especially if the person had a recent heart attack or has congestive heart failure. °· People who have neurological diseases that affect the legs (leg paresis). °· People who have had a traumatic injury, such as breaking a hip or leg. °· People who have recently had major or lengthy surgery, especially on the hip, knee, or abdomen. °· People who have had a central line placed inside a large vein. °· People  who take medicines that contain the hormone estrogen. These include birth control pills and hormone replacement therapy. °· Pregnancy or during childbirth or the postpartum period. °· Long plane flights (over 8 hours). °SIGNS AND SYMPTOMS °Symptoms of a DVT can include:  °· Swelling of your leg or arm, especially if one side is much worse. °· Warmth and redness of your leg or arm, especially if one side is much worse. °· Pain in your arm or leg. If the clot is in your leg, symptoms may be more noticeable or worse when you stand or walk. °· A feeling of pins and needles, if the clot is in the arm. °The symptoms of a DVT that has traveled to the lungs (pulmonary embolism, PE) usually start suddenly and include: °· Shortness of breath while active or at rest. °· Coughing or coughing up blood or blood-tinged mucus. °· Chest pain that is often worse with deep breaths. °· Rapid or irregular heartbeat. °· Feeling light-headed or dizzy. °· Fainting. °· Feeling anxious. °· Sweating. °There may also be pain and swelling in a leg if that is where the blood clot started. °These symptoms may represent a serious problem that is an emergency. Do not wait to see if the symptoms will go away. Get medical help right away. Call your local emergency services (911 in the [place] Do not drive yourself to the hospital. °DIAGNOSIS °Your health care provider will take a medical history and perform a physical exam. You may also   have other tests, including: °· Blood tests to assess the clotting properties of your blood. °· Imaging tests, such as CT, ultrasound, MRI, X-ray, and other tests to see if you have clots anywhere in your body. °TREATMENT °After a DVT is identified, it can be treated. The type of treatment that you receive depends on many factors, such as the cause of your DVT, your risk for bleeding or developing more clots, and other medical conditions that you have. Sometimes, a combination of treatments is necessary. Treatment  options may be combined and include: °· Monitoring the blood clot with ultrasound. °· Taking medicines by mouth, such as newer blood thinners (anticoagulants), thrombolytics, or warfarin. °· Taking anticoagulant medicine by injection or through an IV tube. °· Wearing compression stockings or using different types of devices. °· Surgery (rare) to remove the blood clot or to place a filter in your abdomen to stop the blood clot from traveling to your lungs. °Treatments for a DVT are often divided into immediate treatment and long-term treatment (up to 3 months after DVT). You can work with your health care provider to choose the treatment program that is best for you. °HOME CARE INSTRUCTIONS °If you are taking a newer oral anticoagulant: °· Take the medicine every single day at the same time each day. °· Understand what foods and drugs interact with this medicine. °· Understand that there are no regular blood tests required when using this medicine. °· Understand the side effects of this medicine, including excessive bruising or bleeding. Ask your health care provider or pharmacist about other possible side effects. °If you are taking warfarin: °· Understand how to take warfarin and know which foods can affect how warfarin works in your body. °· Understand that it is dangerous to take too much or too little warfarin. Too much warfarin increases the risk of bleeding. Too little warfarin continues to allow the risk for blood clots. °· Follow your PT and INR blood testing schedule. The PT and INR results allow your health care provider to adjust your dose of warfarin. It is very important that you have your PT and INR tested as often as told by your health care provider. °· Avoid major changes in your diet, or tell your health care provider before you change your diet. Arrange a visit with a registered dietitian to answer your questions. Many foods, especially foods that are high in vitamin K, can interfere with warfarin  and affect the PT and INR results. Eat a consistent amount of foods that are high in vitamin K, such as: °¨ Spinach, kale, broccoli, cabbage, collard greens, turnip greens, Brussels sprouts, peas, cauliflower, seaweed, and parsley. °¨ Beef liver and pork liver. °¨ Green tea. °¨ Soybean oil. °· Tell your health care provider about any and all medicines, vitamins, and supplements that you take, including aspirin and other over-the-counter anti-inflammatory medicines. Be especially cautious with aspirin and anti-inflammatory medicines. Do not take those before you ask your health care provider if it is safe to do so. This is important because many medicines can interfere with warfarin and affect the PT and INR results. °· Do not start or stop taking any over-the-counter or prescription medicine unless your health care provider or pharmacist tells you to do so. °If you take warfarin, you will also need to do these things: °· Hold pressure over cuts for longer than usual. °· Tell your dentist and other health care providers that you are taking warfarin before you have any procedures in which   bleeding may occur. °· Avoid alcohol or drink very small amounts. Tell your health care provider if you change your alcohol intake. °· Do not use tobacco products, including cigarettes, chewing tobacco, and e-cigarettes. If you need help quitting, ask your health care provider. °· Avoid contact sports. °General Instructions °· Take over-the-counter and prescription medicines only as told by your health care provider. Anticoagulant medicines can have side effects, including easy bruising and difficulty stopping bleeding. If you are prescribed an anticoagulant, you will also need to do these things: °¨ Hold pressure over cuts for longer than usual. °¨ Tell your dentist and other health care providers that you are taking anticoagulants before you have any procedures in which bleeding may occur. °¨ Avoid contact sports. °· Wear a medical  alert bracelet or carry a medical alert card that says you have had a PE. °· Ask your health care provider how soon you can go back to your normal activities. Stay active to prevent new blood clots from forming. °· Make sure to exercise while traveling or when you have been sitting or standing for a long period of time. It is very important to exercise. Exercise your legs by walking or by tightening and relaxing your leg muscles often. Take frequent walks. °· Wear compression stockings as told by your health care provider to help prevent more blood clots from forming. °· Do not use tobacco products, including cigarettes, chewing tobacco, and e-cigarettes. If you need help quitting, ask your health care provider. °· Keep all follow-up appointments with your health care provider. This is important. °PREVENTION °Take these actions to decrease your risk of developing another DVT: °· Exercise regularly. For at least 30 minutes every day, engage in: °¨ Activity that involves moving your arms and legs. °¨ Activity that encourages good blood flow through your body by increasing your heart rate. °· Exercise your arms and legs every hour during long-distance travel (over 4 hours). Drink plenty of water and avoid drinking alcohol while traveling. °· Avoid sitting or lying in bed for long periods of time without moving your legs. °· Maintain a weight that is appropriate for your height. Ask your health care provider what weight is healthy for you. °· If you are a woman who is over 35 years of age, avoid unnecessary use of medicines that contain estrogen. These include birth control pills. °· Do not smoke, especially if you take estrogen medicines. If you need help quitting, ask your health care provider. °If you are hospitalized, prevention measures may include: °· Early walking after surgery, as soon as your health care provider says that it is safe. °· Receiving anticoagulants to prevent blood clots. If you cannot take  anticoagulants, other options may be available, such as wearing compression stockings or using different types of devices. °SEEK IMMEDIATE MEDICAL CARE IF: °· You have new or increased pain, swelling, or redness in an arm or leg. °· You have numbness or tingling in an arm or leg. °· You have shortness of breath while active or at rest. °· You have chest pain. °· You have a rapid or irregular heartbeat. °· You feel light-headed or dizzy. °· You cough up blood. °· You notice blood in your vomit, bowel movement, or urine. °These symptoms may represent a serious problem that is an emergency. Do not wait to see if the symptoms will go away. Get medical help right away. Call your local emergency services (911 in the [place] Do not drive yourself to the hospital. °  °  This information is not intended to replace advice given to you by your health care provider. Make sure you discuss any questions you have with your health care provider.   Document Released: 04/28/2005 Document Revised: 01/17/2015 Document Reviewed: 08/23/2014 Elsevier Interactive Patient Education [employer].  Please follow-up with your primary care provider in 3 days for reevaluation. If new or worsening signs or symptoms present including chest pain, shortness of breath, increased swelling of the lower extremity please return immediately to the emergency room for further evaluation and management. ---------------------------------------------------------------------------------------------------------------------------------------------------- Information on my medicine - XARELTO (rivaroxaban)  This medication education was reviewed with me or my healthcare representative as part of my discharge preparation.  The pharmacist that spoke with me during my hospital stay was:  Shyteria Lewis Barbee Cough, [place]? Xarelto was prescribed to treat blood clots that may have been found in the veins of your legs (deep vein  thrombosis) or in your lungs (pulmonary embolism) and to reduce the risk of them occurring again.  What do you need to know about Xarelto? The starting dose is one 15 mg tablet taken TWICE daily with food for the FIRST 21 DAYS then on  03/23/2015  the dose is changed to one 20 mg tablet taken ONCE A DAY with your evening meal.  DO NOT stop taking Xarelto without talking to the health care provider who prescribed the medication.  Refill your prescription for 20 mg tablets before you run out.  After discharge, you should have regular check-up appointments with your healthcare provider that is prescribing your Xarelto.  In the future your dose may need to be changed if your kidney function changes by a significant amount.  What do you do if you miss a dose? If you are taking Xarelto TWICE DAILY and you miss a dose, take it as soon as you remember. You may take two 15 mg tablets (total 30 mg) at the same time then resume your regularly scheduled 15 mg twice daily the next day.  If you are taking Xarelto ONCE DAILY and you miss a dose, take it as soon as you remember on the same day then continue your regularly scheduled once daily regimen the next day. Do not take two doses of Xarelto at the same time.   Important Safety Information Xarelto is a blood thinner medicine that can cause bleeding. You should call your healthcare provider right away if you experience any of the following: ? Bleeding from an injury or your nose that does not stop. ? Unusual colored urine (red or dark brown) or unusual colored stools (red or black). ? Unusual bruising for unknown reasons. ? A serious fall or if you hit your head (even if there is no bleeding).  Some medicines may interact with Xarelto and might increase your risk of bleeding while on Xarelto. To help avoid this, consult your healthcare provider or pharmacist prior to using any new prescription or non-prescription medications, including herbals,  vitamins, non-steroidal anti-inflammatory drugs (NSAIDs) and supplements.  This website has more information on Xarelto: [email or web address]

## 2015-03-01 NOTE — ED Notes (Signed)
Pt stable, ambulatory, states understanding of discharge instructions, wife at bedside.

## 2015-04-18 ENCOUNTER — Other Ambulatory Visit (HOSPITAL_COMMUNITY): Payer: Self-pay | Admitting: Family Medicine

## 2015-04-18 DIAGNOSIS — I82491 Acute embolism and thrombosis of other specified deep vein of right lower extremity: Secondary | ICD-10-CM

## 2015-05-30 ENCOUNTER — Ambulatory Visit
Admission: RE | Admit: 2015-05-30 | Discharge: 2015-05-30 | Disposition: A | Discharge status: Home / Self Care | Payer: BLUE CROSS/BLUE SHIELD | Source: Ambulatory Visit | Attending: Family Medicine | Admitting: Family Medicine

## 2015-05-30 DIAGNOSIS — I82491 Acute embolism and thrombosis of other specified deep vein of right lower extremity: Secondary | ICD-10-CM

## 2015-06-05 ENCOUNTER — Other Ambulatory Visit: Payer: BC Managed Care – PPO

## 2018-10-28 ENCOUNTER — Encounter: Payer: Self-pay | Admitting: *Deleted

## 2018-11-20 ENCOUNTER — Encounter: Payer: Self-pay | Admitting: Internal Medicine

## 2019-05-12 ENCOUNTER — Ambulatory Visit: Payer: BC Managed Care – PPO | Attending: Internal Medicine

## 2019-05-12 DIAGNOSIS — Z20822 Contact with and (suspected) exposure to covid-19: Secondary | ICD-10-CM

## 2019-05-14 LAB — NOVEL CORONAVIRUS, NAA: SARS-CoV-2, NAA: NOT DETECTED

## 2019-12-14 ENCOUNTER — Other Ambulatory Visit: Payer: Self-pay | Admitting: Family Medicine

## 2019-12-14 DIAGNOSIS — Z136 Encounter for screening for cardiovascular disorders: Secondary | ICD-10-CM

## 2019-12-14 DIAGNOSIS — Z87891 Personal history of nicotine dependence: Secondary | ICD-10-CM

## 2019-12-21 ENCOUNTER — Ambulatory Visit
Admission: RE | Admit: 2019-12-21 | Discharge: 2019-12-21 | Disposition: A | Discharge status: Home / Self Care | Payer: BC Managed Care – PPO | Source: Ambulatory Visit | Attending: Family Medicine | Admitting: Family Medicine

## 2019-12-21 DIAGNOSIS — Z87891 Personal history of nicotine dependence: Secondary | ICD-10-CM

## 2019-12-21 DIAGNOSIS — Z136 Encounter for screening for cardiovascular disorders: Secondary | ICD-10-CM

## 2020-04-05 ENCOUNTER — Emergency Department (HOSPITAL_COMMUNITY): Payer: BC Managed Care – PPO

## 2020-04-05 ENCOUNTER — Encounter (HOSPITAL_COMMUNITY): Payer: Self-pay

## 2020-04-05 ENCOUNTER — Emergency Department (HOSPITAL_COMMUNITY)
Admission: EM | Admit: 2020-04-05 | Discharge: 2020-04-05 | Disposition: A | Discharge status: Home / Self Care | Payer: BC Managed Care – PPO | Attending: Emergency Medicine | Admitting: Emergency Medicine

## 2020-04-05 ENCOUNTER — Other Ambulatory Visit: Payer: Self-pay

## 2020-04-05 DIAGNOSIS — Z87891 Personal history of nicotine dependence: Secondary | ICD-10-CM | POA: Diagnosis not present

## 2020-04-05 DIAGNOSIS — S0121XA Laceration without foreign body of nose, initial encounter: Secondary | ICD-10-CM | POA: Diagnosis not present

## 2020-04-05 DIAGNOSIS — S022XXB Fracture of nasal bones, initial encounter for open fracture: Secondary | ICD-10-CM

## 2020-04-05 DIAGNOSIS — W2105XA Struck by basketball, initial encounter: Secondary | ICD-10-CM | POA: Insufficient documentation

## 2020-04-05 DIAGNOSIS — Y9367 Activity, basketball: Secondary | ICD-10-CM | POA: Insufficient documentation

## 2020-04-05 DIAGNOSIS — Z7901 Long term (current) use of anticoagulants: Secondary | ICD-10-CM | POA: Insufficient documentation

## 2020-04-05 DIAGNOSIS — S50312A Abrasion of left elbow, initial encounter: Secondary | ICD-10-CM | POA: Diagnosis not present

## 2020-04-05 DIAGNOSIS — S0993XA Unspecified injury of face, initial encounter: Secondary | ICD-10-CM | POA: Diagnosis present

## 2020-04-05 MED ORDER — AMOXICILLIN-POT CLAVULANATE 875-125 MG PO TABS
1.0000 | ORAL_TABLET | Freq: Once | ORAL | Status: AC
  Administered 2020-04-05: 1 via ORAL
  Filled 2020-04-05: qty 1

## 2020-04-05 MED ORDER — AMOXICILLIN-POT CLAVULANATE 875-125 MG PO TABS: 1.0000 | ORAL_TABLET | Freq: Two times a day (BID) | ORAL | 0 refills | Status: AC

## 2020-04-05 MED ORDER — LIDOCAINE-EPINEPHRINE 2 %-1:100000 IJ SOLN
20.0000 mL | Freq: Once | INTRAMUSCULAR | Status: AC
  Administered 2020-04-05: 20 mL
  Filled 2020-04-05: qty 1

## 2020-04-05 MED ORDER — HYDROCODONE-ACETAMINOPHEN 5-325 MG PO TABS
1.0000 | ORAL_TABLET | Freq: Once | ORAL | Status: AC
Start: 2020-04-05 — End: 2020-04-05
  Administered 2020-04-05: 1 via ORAL
  Filled 2020-04-05: qty 1

## 2020-04-05 MED ORDER — OXYMETAZOLINE HCL 0.05 % NA SOLN
1.0000 | Freq: Once | NASAL | Status: AC
  Administered 2020-04-05: 1 via NASAL
  Filled 2020-04-05 (×2): qty 30

## 2020-04-05 MED ORDER — BACITRACIN ZINC 500 UNIT/GM EX OINT
TOPICAL_OINTMENT | CUTANEOUS | Status: AC
  Administered 2020-04-05: 2
  Filled 2020-04-05: qty 0.9

## 2020-04-05 MED ORDER — HYDROCODONE-ACETAMINOPHEN 5-325 MG PO TABS: 1.0000 | ORAL_TABLET | ORAL | 0 refills | Status: AC | PRN

## 2020-04-05 MED ORDER — BACITRACIN ZINC 500 UNIT/GM EX OINT
TOPICAL_OINTMENT | CUTANEOUS | Status: AC
  Administered 2020-04-05: 1
  Filled 2020-04-05: qty 0.9

## 2020-04-05 NOTE — Discharge Instructions (Signed)
Sutures out in 5 to 7 days.  Remove the cottonball from your left nose in the morning.  Return here if bleeding is uncontrolled despite placing a second cotton ball.

## 2020-04-05 NOTE — ED Provider Notes (Addendum)
[facility] DEPT Provider Note   CSN: [id number] Arrival date & time: 04/05/20  1752     History Chief Complaint  Patient presents with  . Facial Injury    Gabriel Delgado is a 59 y.o. male.  59 year old male presents here after sustaining an injury to his face from an elbow while playing basketball. States he was struck in his nose and did not have any loss of consciousness. Denies any neck pain. Has had some bilateral hand paresthesias but denies any weakness in his upper extremities greater than lower. Has had some bleeding mostly from his left nares. Denies any nausea. No visual changes. EMS called and patient transported here. Patient's tetanus status is up-to-date        Past Medical History:  Diagnosis Date  . Thyroid disease    hypothryoid    Patient Active Problem List   Diagnosis Date Noted  . Foot arch pain 12/10/2011  . Plantar fasciitis of left foot 10/29/2011  . Left Chronic Hamstring strain 10/29/2011    History reviewed. No pertinent surgical history.     Family History  Problem Relation Age of Onset  . Colon polyps Father   . Colon cancer Neg Hx   . Esophageal cancer Neg Hx   . Rectal cancer Neg Hx   . Stomach cancer Neg Hx     Social History   Tobacco Use  . Smoking status: Former Smoker    Quit date: 05/12/1997    Years since quitting: 22.9  . Smokeless tobacco: Never Used  Substance Use Topics  . Alcohol use: Yes    Comment: few beers on weekend  . Drug use: No    Home Medications Prior to Admission medications   Medication Sig Start Date End Date Taking? Authorizing Provider  ALPRAZolam Duanne Moron) 0.5 MG tablet Take 0.5 mg by mouth as needed. 12/23/11   [provider]  Brimonidine Tartrate (MIRVASO) 0.33 % GEL Apply topically daily.    [provider]  levothyroxine (SYNTHROID, LEVOTHROID) 50 MCG tablet Take 50 mcg by mouth daily before breakfast.    [provider]    metroNIDAZOLE (METROGEL) 1 % gel Apply topically daily.    [provider]  propranolol (INDERAL) 10 MG tablet Take 10 mg by mouth 2 (two) times daily. 03/20/20   [provider]  Rivaroxaban (XARELTO) 15 MG TABS tablet Take 1 tablet (15 mg total) by mouth 2 (two) times daily. 03/01/15   Hedges, Dellis Filbert, PA-C  rivaroxaban (XARELTO) 20 MG TABS tablet Take 1 tablet (20 mg total) by mouth daily with supper. 03/23/15   Okey Regal, PA-C    Allergies    Patient has no known allergies.  Review of Systems   Review of Systems  All other systems reviewed and are negative.   Physical Exam Updated Vital Signs BP (!) 132/91 (BP Location: Right Arm)   Pulse 84   Temp (!) 97.5 F (36.4 C) (Axillary)   Resp 18   SpO2 97%   Physical Exam Vitals and nursing note reviewed.  Constitutional:      General: He is not in acute distress.    Appearance: Normal appearance. He is well-developed. He is not toxic-appearing.  HENT:     Head: Normocephalic and atraumatic.     Nose:      Comments: No septal hematoma. Some bleeding from the left nares. Nose did not appear to be deviated however it is bilateral edematous Eyes:     General:  Lids are normal.     Conjunctiva/sclera: Conjunctivae normal.     Pupils: Pupils are equal, round, and reactive to light.  Neck:     Thyroid: No thyroid mass.     Trachea: No tracheal deviation.  Cardiovascular:     Rate and Rhythm: Normal rate and regular rhythm.     Heart sounds: Normal heart sounds. No murmur heard.  No gallop.   Pulmonary:     Effort: Pulmonary effort is normal. No respiratory distress.     Breath sounds: Normal breath sounds. No stridor. No decreased breath sounds, wheezing, rhonchi or rales.  Abdominal:     General: Bowel sounds are normal. There is no distension.     Palpations: Abdomen is soft.     Tenderness: There is no abdominal tenderness. There is no rebound.  Musculoskeletal:        General: No tenderness.  Normal range of motion.     Cervical back: Normal range of motion and neck supple.     Comments: Superficial abrasion noted to patient's left elbow.  Full range of motion.  No suturable wounds  Skin:    General: Skin is warm and dry.     Findings: No abrasion or rash.  Neurological:     Mental Status: He is alert and oriented to person, place, and time.     GCS: GCS eye subscore is 4. GCS verbal subscore is 5. GCS motor subscore is 6.     Cranial Nerves: No cranial nerve deficit.     Sensory: No sensory deficit.     Comments: Strength is 5 of 5 in upper as well as lower extremities. Sensation intact in both hands. Good radial pulses in both wrists  Psychiatric:        Speech: Speech normal.        Behavior: Behavior normal.     ED Results / Procedures / Treatments   Labs (all labs ordered are listed, but only abnormal results are displayed) Labs Reviewed - No data to display  EKG None  Radiology No results found.  Procedures Procedures (including critical care time)  Medications Ordered in ED Medications  lidocaine-EPINEPHrine (XYLOCAINE W/EPI) 2 %-1:100000 (with pres) injection 20 mL (has no administration in time range)  bacitracin 500 UNIT/GM ointment (2 application  Given 93/73/42 1841)    ED Course  I have reviewed the triage vital signs and the nursing notes.  Pertinent labs & imaging results that were available during my care of the patient were reviewed by me and considered in my medical decision making (see chart for details).    MDM Rules/Calculators/A&P                          LACERATION REPAIR Performed by: Leota Jacobsen Authorized by: Leota Jacobsen Consent: Verbal consent obtained. Risks and benefits: risks, benefits and alternatives were discussed Consent given by: patient Patient identity confirmed: provided demographic data Prepped and Draped in normal sterile fashion Wound explored  Laceration Location: Nose  Laceration Length: 2  cm  No Foreign Bodies seen or palpated  Anesthesia: local infiltration  Local anesthetic: lidocaine 2%  Anesthetic total: 4 ml  Irrigation method: syringe Amount of cleaning: standard  Skin closure: 6-0 prolene  Number of sutures: 8  Technique: simple  Patient tolerance: Patient tolerated the procedure well with no immediate complications. Final Clinical Impression(s) / ED Diagnoses Final diagnoses:  None  Patient had bleeding from his left  nares that was controlled with cotton ball which was wrapped and bacitracin.  Nasal sutures are intact without bleeding.  Skin is normal.  Patient has multiple nasal bone fractures.  Will give referral to maxillofacial on-call.  Prescribe analgesics as well as antibiotics and patient given instructions on how to remove this packing.      Rx / DC Orders ED Discharge Orders    None       Lacretia Leigh, MD 04/05/20 2047    Lacretia Leigh, MD 04/05/20 2051

## 2020-04-05 NOTE — ED Notes (Signed)
Unable to obtain pts oral temp due to pt not being able to breathe through his nose long enough to close his mouth. Attempted twice.

## 2020-04-05 NOTE — ED Triage Notes (Signed)
Pt BIB EMS. Pt was playing basketball when he got hit in the face by his friends elbow. Pt has bleeding on top of his nose and coming out of his nose; bandage over top of nose. Denies any dizziness or lightheadedness. Pt is complaining of hands tingling. Has lac to the left elbow from when he fell to the ground after the injury.    EMS stated initial BP was 160/89 then last bp was 98/70 O2-98% RA HR-70

## 2020-04-06 ENCOUNTER — Other Ambulatory Visit: Payer: Self-pay

## 2020-04-06 ENCOUNTER — Emergency Department (HOSPITAL_COMMUNITY): Payer: BC Managed Care – PPO

## 2020-04-06 ENCOUNTER — Emergency Department (HOSPITAL_COMMUNITY)
Admission: EM | Admit: 2020-04-06 | Discharge: 2020-04-06 | Disposition: A | Discharge status: Home / Self Care | Payer: BC Managed Care – PPO | Attending: Emergency Medicine | Admitting: Emergency Medicine

## 2020-04-06 ENCOUNTER — Encounter (HOSPITAL_COMMUNITY): Payer: Self-pay

## 2020-04-06 DIAGNOSIS — Z87891 Personal history of nicotine dependence: Secondary | ICD-10-CM | POA: Diagnosis not present

## 2020-04-06 DIAGNOSIS — I10 Essential (primary) hypertension: Secondary | ICD-10-CM | POA: Insufficient documentation

## 2020-04-06 DIAGNOSIS — R040 Epistaxis: Secondary | ICD-10-CM | POA: Insufficient documentation

## 2020-04-06 DIAGNOSIS — Z79899 Other long term (current) drug therapy: Secondary | ICD-10-CM | POA: Diagnosis not present

## 2020-04-06 DIAGNOSIS — E039 Hypothyroidism, unspecified: Secondary | ICD-10-CM | POA: Diagnosis not present

## 2020-04-06 DIAGNOSIS — M25512 Pain in left shoulder: Secondary | ICD-10-CM | POA: Diagnosis not present

## 2020-04-06 DIAGNOSIS — G5603 Carpal tunnel syndrome, bilateral upper limbs: Secondary | ICD-10-CM | POA: Diagnosis not present

## 2020-04-06 DIAGNOSIS — Z859 Personal history of malignant neoplasm, unspecified: Secondary | ICD-10-CM | POA: Diagnosis not present

## 2020-04-06 DIAGNOSIS — Z7901 Long term (current) use of anticoagulants: Secondary | ICD-10-CM | POA: Insufficient documentation

## 2020-04-06 DIAGNOSIS — R202 Paresthesia of skin: Secondary | ICD-10-CM | POA: Diagnosis present

## 2020-04-06 HISTORY — DX: Malignant (primary) neoplasm, unspecified: C80.1

## 2020-04-06 HISTORY — DX: Essential (primary) hypertension: I10

## 2020-04-06 MED ORDER — PREDNISONE 10 MG (21) PO TBPK: ORAL_TABLET | Freq: Every day | ORAL | 0 refills | Status: AC

## 2020-04-06 MED ORDER — ACETAMINOPHEN 325 MG PO TABS
650.0000 mg | ORAL_TABLET | Freq: Once | ORAL | Status: AC
  Administered 2020-04-06: 650 mg via ORAL
  Filled 2020-04-06: qty 2

## 2020-04-06 NOTE — Progress Notes (Signed)
Orthopedic Tech Progress Note Patient Details:  Gabriel Delgado [facility] Aug 10, 1960 [id number]  Ortho Devices Ortho Device/Splint Location: (B) velcro wrist splints and arm sling for LUE Ortho Device/Splint Interventions: Ordered, Application   Post Interventions Patient Tolerated: Well Instructions Provided: Care of device   Braulio Bosch 04/06/2020, 12:26 PM

## 2020-04-06 NOTE — ED Provider Notes (Signed)
[facility] DEPT Provider Note   CSN: [id number] Arrival date & time: 04/06/20  4235     History Chief Complaint  Patient presents with  . Epistaxis  . hand numbness    Gabriel Delgado is a 59 y.o. male.  HPI   59 year old male with a history of cancer, hypertension, thyroid disease, who presents to the emergency department today for evaluation of epistaxis and bilateral hand numbness.  Patient was seen in the ED yesterday after he was elbowed in the nose while playing basketball.  He sustained a laceration to the nose and also fractures of his nose.  He states since going home he has noticed that he had some continued bleeding from his nose.  He was told to remove the packing today but he has yet to do so.  Additionally, he is complaining of pain/tingling to the bilateral hands.  He also reports some weakness with his grip strength.  He does not have any neck pain.  He does not believe that he hit his head when he fell.  He thinks he may have landed on his hands when he fell yesterday but he cannot exactly remember because it happened so quickly.  Past Medical History:  Diagnosis Date  . Cancer ([place])   . Hypertension   . Thyroid disease    hypothryoid    Patient Active Problem List   Diagnosis Date Noted  . Foot arch pain 12/10/2011  . Plantar fasciitis of left foot 10/29/2011  . Left Chronic Hamstring strain 10/29/2011    History reviewed. No pertinent surgical history.     Family History  Problem Relation Age of Onset  . Colon polyps Father   . Colon cancer Neg Hx   . Esophageal cancer Neg Hx   . Rectal cancer Neg Hx   . Stomach cancer Neg Hx     Social History   Tobacco Use  . Smoking status: Former Smoker    Quit date: 05/12/1997    Years since quitting: 22.9  . Smokeless tobacco: Never Used  Vaping Use  . Vaping Use: Never used  Substance Use Topics  . Alcohol use: Yes    Comment: few beers on weekend  . Drug use: No     Home Medications Prior to Admission medications   Medication Sig Start Date End Date Taking? Authorizing Provider  ibuprofen (ADVIL) 200 MG tablet Take 200-400 mg by mouth every 6 (six) hours as needed for moderate pain.   Yes [provider]  levothyroxine (SYNTHROID) 75 MCG tablet Take 75 mcg by mouth daily. 12/09/19  Yes [provider]  propranolol (INDERAL) 10 MG tablet Take 10 mg by mouth daily.  03/20/20  Yes [provider]  amoxicillin-clavulanate (AUGMENTIN) 875-125 MG tablet Take 1 tablet by mouth every 12 (twelve) hours. 04/05/20   Lacretia Leigh, MD  HYDROcodone-acetaminophen (NORCO/VICODIN) 5-325 MG tablet Take 1-2 tablets by mouth every 4 (four) hours as needed. 04/05/20   Lacretia Leigh, MD  predniSONE (STERAPRED UNI-PAK 21 TAB) 10 MG (21) TBPK tablet Take by mouth daily. Take 6 tabs by mouth daily  for 2 days, then 5 tabs for 2 days, then 4 tabs for 2 days, then 3 tabs for 2 days, 2 tabs for 2 days, then 1 tab by mouth daily for 2 days 04/06/20   Venba Zenner S, PA-C  Rivaroxaban (XARELTO) 15 MG TABS tablet Take 1 tablet (15 mg total) by mouth 2 (two) times daily. Patient not taking: Reported  on 04/05/2020 03/01/15   Okey Regal, PA-C  rivaroxaban (XARELTO) 20 MG TABS tablet Take 1 tablet (20 mg total) by mouth daily with supper. Patient not taking: Reported on 04/05/2020 03/23/15   Okey Regal, PA-C    Allergies    Patient has no known allergies.  Review of Systems   Review of Systems  Constitutional: Negative for fever.  HENT: Positive for nosebleeds.   Respiratory: Negative for shortness of breath.   Cardiovascular: Negative for chest pain.  Gastrointestinal: Negative for nausea and vomiting.  Genitourinary: Negative for flank pain.  Musculoskeletal: Negative for back pain and neck pain.       Bilat hand pain  Skin: Negative for rash.  Neurological: Positive for weakness.       Paresthesias  All other systems reviewed and  are negative.   Physical Exam Updated Vital Signs BP (!) 127/91 (BP Location: Left Arm)   Pulse 70   Temp 98.2 F (36.8 C) (Oral)   Resp 16   Ht 5\' 10"  (1.778 m)   Wt 81.6 kg   SpO2 98%   BMI 25.83 kg/m   Physical Exam Vitals and nursing note reviewed.  Constitutional:      Appearance: He is well-developed.  HENT:     Head: Normocephalic and atraumatic.     Nose:     Comments: Nasal packing present.  When removed there is no continued bleeding on exam.  Sutures in place appear intact. Eyes:     Conjunctiva/sclera: Conjunctivae normal.  Cardiovascular:     Rate and Rhythm: Normal rate and regular rhythm.     Heart sounds: No murmur heard.   Pulmonary:     Effort: Pulmonary effort is normal. No respiratory distress.     Breath sounds: Normal breath sounds.  Abdominal:     Palpations: Abdomen is soft.     Tenderness: There is no abdominal tenderness.  Musculoskeletal:     Cervical back: Neck supple.     Comments: There is no tenderness to the cervical spine on exam. TTP to the left shoulder and to the left medial epicondyles.    Skin:    General: Skin is warm and dry.  Neurological:     Mental Status: He is alert.     Comments: 3/5 grip strength bilaterally.  Strength testing at elbow/shoulder joint appears grossly intact but slightly weaker on the left likely secondary to pain at the elbow and shoulder.  Paresthesias noted to the thumb and index fingers bilaterally.  Tinel's sign is negative but Phalen's test is positive.     ED Results / Procedures / Treatments   Labs (all labs ordered are listed, but only abnormal results are displayed) Labs Reviewed - No data to display  EKG None  Radiology DG Elbow Complete Left  Result Date: 04/06/2020 CLINICAL DATA:  Injury. EXAM: LEFT ELBOW - COMPLETE 3+ VIEW COMPARISON:  No recent prior. FINDINGS: Left elbow joint effusion cannot be completely excluded. No displaced fracture. No dislocation. IMPRESSION: Left elbow  joint effusion cannot be completely excluded. No acute bony abnormality identified. Electronically Signed   By: Marcello Moores  Register   On: 04/06/2020 10:30   CT Cervical Spine Wo Contrast  Result Date: 04/06/2020 CLINICAL DATA:  Neck trauma. EXAM: CT CERVICAL SPINE WITHOUT CONTRAST TECHNIQUE: Multidetector CT imaging of the cervical spine was performed without intravenous contrast. Multiplanar CT image reconstructions were also generated. COMPARISON:  None. FINDINGS: Alignment: No substantial subluxation. Skull base and vertebrae: No evidence of acute fracture.  Vertebral body heights are maintained. Soft tissues and spinal canal: No prevertebral fluid or swelling. No visible canal hematoma. Disc levels: Mild multilevel degenerative disc disease. Multilevel uncovertebral hypertrophy with varying degrees of foraminal stenosis. Upper chest: Paraseptal emphysema. Other: Calcific atherosclerosis at the carotid bifurcation. IMPRESSION: No evidence of acute fracture or traumatic malalignment. Electronically Signed   By: Margaretha Sheffield MD   On: 04/06/2020 10:47   DG Shoulder Left  Result Date: 04/06/2020 CLINICAL DATA:  pain EXAM: LEFT SHOULDER - 2+ VIEW COMPARISON:  None. FINDINGS: Normal alignment with approximation of the joints. Glenoid fossa subchondral sclerosis and degenerative spurring. No fracture. Mild acromioclavicular osteoarthrosis. No focal soft tissue abnormality. IMPRESSION: Mild glenohumeral and acromioclavicular osteoarthrosis. No fracture or dislocation. Electronically Signed   By: Primitivo Gauze M.D.   On: 04/06/2020 10:32   CT Maxillofacial Wo Contrast  Result Date: 04/05/2020 CLINICAL DATA:  Pt was playing basketball when he got hit in the face by his friends elbow. Pt has bleeding on top of his nose and coming out of his nose; bandage over top of nose EXAM: CT MAXILLOFACIAL WITHOUT CONTRAST TECHNIQUE: Multidetector CT imaging of the maxillofacial structures was performed.  Multiplanar CT image reconstructions were also generated. COMPARISON:  None. FINDINGS: Osseous: Comminuted and displaced bilateral nasal bone fractures. Distal nasal process of the left maxilla tiny acute medially displaced fracture (4:30). Comminuted and deviated nasal septal fracture. No definite lamina papyracea, orbital floor fracture, or crista galea focal Orbits: Negative. No traumatic or inflammatory finding. Sinuses: Packing within the left nasal cavity. High density fluid within the left maxillary sinus, bilateral ethmoid sinuses, and left nasal cavity consistent with blood products. Blood products and frothy secretions noted within the nasopharynx. Mild mucosal thickening within bilateral frontal sinuses. Sphenoid sinus and mastoid air cells are clear. Soft tissues: Associated nasal subcutaneus soft tissue edema. No large subcutaneus soft tissue hematoma formation. Limited intracranial: No pneumocephalus. No hyperdensity. No significant or unexpected finding. Other: Visualized portions of the upper cervical spine are grossly unremarkable with no acute displaced fracture or traumatic listhesis. Calcifications of the carotid arteries are mild within the neck. IMPRESSION: Acute comminuted and displaced bilateral nasal bone fractures. Associated tiny acute distal nasal process of the left maxilla fracture and an acute comminuted and deviated nasal septal fracture. Electronically Signed   By: Iven Finn M.D.   On: 04/05/2020 19:35    Procedures Procedures (including critical care time)  Medications Ordered in ED Medications - No data to display  ED Course  I have reviewed the triage vital signs and the nursing notes.  Pertinent labs & imaging results that were available during my care of the patient were reviewed by me and considered in my medical decision making (see chart for details).    MDM Rules/Calculators/A&P                          59 year old male presenting to the emergency  department today for evaluation of bilateral hand numbness and continued epistaxis after a nose injury yesterday.  Reviewed chart from yesterday which showed multiple nasal bone fractures with a deviated nasal septal fracture as well.  Packing was removed to the nose and patient did not have any continued epistaxis while in the ED.  Advised to take abx and f/u with ENT as recommended at his visit yesterday.   Additionally, in regards to his bilateral hand numbness.  Patient does not have any neck pain.  The CT scan  of his neck did not show any acute traumatic injury or malalignment.  He did have a positive Phalen's test on exam suggesting more peripheral cause of his symptoms as this seems to be in the radial nerve distribution.  Additionally the x-ray of the left elbow and left shoulder did not show any signs of fracture.  We will give patient bilateral wrist splints and a course of steroids. Also concerned that he may have shoulder injury that may need further work-up.  Will give him follow-up with hand.  Advised on return precautions.  He voices understanding the plan and reasons to return.  all questions answered.  Patient stable for discharge.  Final Clinical Impression(s) / ED Diagnoses Final diagnoses:  Epistaxis  Bilateral carpal tunnel syndrome  Acute pain of left shoulder    Rx / DC Orders ED Discharge Orders         Ordered    predniSONE (STERAPRED UNI-PAK 21 TAB) 10 MG (21) TBPK tablet  Daily        04/06/20 [address], Lashanda Storlie S, PA-C 04/06/20 [place], [place], MD 04/07/20 [phone]

## 2020-04-06 NOTE — ED Triage Notes (Signed)
Patient was seen in the ED yesterday.  Patient continues to have bleeding from his nose and hand numbness bilaterally and worse when he raises his arms.

## 2020-04-06 NOTE — Discharge Instructions (Signed)
Take steroids as directed.   Use the wrist splints at night.   You were given a referral to the hand surgery team.  Please call the office schedule an appointment for follow-up.  Please return the emergency department for any new or worsening symptoms in the meantime.

## 2020-04-11 ENCOUNTER — Ambulatory Visit
Admission: RE | Admit: 2020-04-11 | Discharge: 2020-04-11 | Disposition: A | Discharge status: Home / Self Care | Payer: BC Managed Care – PPO | Source: Ambulatory Visit | Attending: Family Medicine | Admitting: Family Medicine

## 2020-04-11 ENCOUNTER — Other Ambulatory Visit: Payer: Self-pay | Admitting: Family Medicine

## 2020-04-11 DIAGNOSIS — M7989 Other specified soft tissue disorders: Secondary | ICD-10-CM

## 2020-04-11 DIAGNOSIS — M79605 Pain in left leg: Secondary | ICD-10-CM

## 2020-07-18 ENCOUNTER — Other Ambulatory Visit: Payer: Self-pay | Admitting: Family Medicine

## 2020-07-18 DIAGNOSIS — I82502 Chronic embolism and thrombosis of unspecified deep veins of left lower extremity: Secondary | ICD-10-CM

## 2020-07-18 NOTE — Progress Notes (Signed)
LLE DVT. Started on Xarelto 04/11/20. No sx at this time.

## 2020-07-23 ENCOUNTER — Other Ambulatory Visit: Payer: BC Managed Care – PPO

## 2020-07-26 ENCOUNTER — Ambulatory Visit
Admission: RE | Admit: 2020-07-26 | Discharge: 2020-07-26 | Disposition: A | Discharge status: Home / Self Care | Payer: 59 | Source: Ambulatory Visit | Attending: Family Medicine | Admitting: Family Medicine

## 2020-07-26 DIAGNOSIS — I82502 Chronic embolism and thrombosis of unspecified deep veins of left lower extremity: Secondary | ICD-10-CM

## 2021-02-28 ENCOUNTER — Other Ambulatory Visit: Payer: Self-pay | Admitting: Family Medicine

## 2021-02-28 DIAGNOSIS — M79605 Pain in left leg: Secondary | ICD-10-CM

## 2021-03-01 ENCOUNTER — Ambulatory Visit
Admission: RE | Admit: 2021-03-01 | Discharge: 2021-03-01 | Disposition: A | Discharge status: Home / Self Care | Payer: 59 | Source: Ambulatory Visit | Attending: Family Medicine | Admitting: Family Medicine

## 2021-03-01 ENCOUNTER — Other Ambulatory Visit: Payer: 59

## 2021-03-01 DIAGNOSIS — M7989 Other specified soft tissue disorders: Secondary | ICD-10-CM

## 2021-03-01 DIAGNOSIS — M79605 Pain in left leg: Secondary | ICD-10-CM

## 2022-02-28 ENCOUNTER — Other Ambulatory Visit: Payer: Self-pay | Admitting: Family Medicine

## 2022-02-28 DIAGNOSIS — I2510 Atherosclerotic heart disease of native coronary artery without angina pectoris: Secondary | ICD-10-CM

## 2022-02-28 NOTE — Progress Notes (Signed)
ASCVD risk between 7.5-10. Wanting to avoid statin. Screening CAC ordered

## 2022-11-07 ENCOUNTER — Other Ambulatory Visit: Payer: Self-pay | Admitting: Internal Medicine

## 2022-11-07 DIAGNOSIS — E785 Hyperlipidemia, unspecified: Secondary | ICD-10-CM

## 2022-12-09 ENCOUNTER — Ambulatory Visit (HOSPITAL_COMMUNITY): Payer: 59

## 2022-12-11 ENCOUNTER — Ambulatory Visit (HOSPITAL_COMMUNITY)
Admission: RE | Admit: 2022-12-11 | Discharge: 2022-12-11 | Disposition: A | Discharge status: Home / Self Care | Payer: 59 | Source: Ambulatory Visit | Attending: Internal Medicine | Admitting: Internal Medicine

## 2022-12-11 DIAGNOSIS — E785 Hyperlipidemia, unspecified: Secondary | ICD-10-CM | POA: Insufficient documentation

## 2022-12-15 ENCOUNTER — Other Ambulatory Visit: Payer: Self-pay | Admitting: Family Medicine

## 2022-12-15 DIAGNOSIS — I2510 Atherosclerotic heart disease of native coronary artery without angina pectoris: Secondary | ICD-10-CM

## 2022-12-15 NOTE — Progress Notes (Signed)
Recent CAC 1,065.  Exercises regularly w/o restriction H/o DVT on Xarelto On Statin for HLD.  Negative FMHx of HLD, CAD, CVD.

## 2022-12-24 ENCOUNTER — Telehealth (HOSPITAL_COMMUNITY): Payer: Self-pay | Admitting: *Deleted

## 2022-12-24 ENCOUNTER — Encounter (HOSPITAL_COMMUNITY): Payer: Self-pay

## 2022-12-24 MED ORDER — METOPROLOL TARTRATE 50 MG PO TABS: ORAL_TABLET | ORAL | 0 refills | Status: AC

## 2022-12-24 NOTE — Telephone Encounter (Signed)
Reaching out to patient to offer assistance regarding upcoming cardiac imaging study; pt verbalizes understanding of appt date/time, parking situation and where to check in, pre-test NPO status and medications ordered, and verified current allergies; name and call back number provided for further questions should they arise  Larey Brick RN Navigator Cardiac Imaging [facility] [phone] office [phone] cell  Patient to 50mg  metoprolol tartrate two hours prior his cardiac CT if his HR is greater than 65bpm. He is aware to arrive at 11:30 AM.

## 2022-12-25 ENCOUNTER — Ambulatory Visit (HOSPITAL_BASED_OUTPATIENT_CLINIC_OR_DEPARTMENT_OTHER)
Admission: RE | Admit: 2022-12-25 | Discharge: 2022-12-25 | Disposition: A | Discharge status: Home / Self Care | Payer: 59 | Source: Ambulatory Visit | Attending: Cardiology | Admitting: Cardiology

## 2022-12-25 ENCOUNTER — Other Ambulatory Visit: Payer: Self-pay | Admitting: Cardiology

## 2022-12-25 ENCOUNTER — Ambulatory Visit (HOSPITAL_COMMUNITY)
Admission: RE | Admit: 2022-12-25 | Discharge: 2022-12-25 | Disposition: A | Discharge status: Home / Self Care | Payer: 59 | Source: Ambulatory Visit | Attending: Family Medicine | Admitting: Family Medicine

## 2022-12-25 DIAGNOSIS — R931 Abnormal findings on diagnostic imaging of heart and coronary circulation: Secondary | ICD-10-CM | POA: Insufficient documentation

## 2022-12-25 DIAGNOSIS — I2510 Atherosclerotic heart disease of native coronary artery without angina pectoris: Secondary | ICD-10-CM

## 2022-12-25 MED ORDER — NITROGLYCERIN 0.4 MG SL SUBL
SUBLINGUAL_TABLET | SUBLINGUAL | Status: AC
  Filled 2022-12-25: qty 2

## 2022-12-25 MED ORDER — IOHEXOL 350 MG/ML SOLN
95.0000 mL | Freq: Once | INTRAVENOUS | Status: AC | PRN
  Administered 2022-12-25: 95 mL via INTRAVENOUS

## 2022-12-25 MED ORDER — NITROGLYCERIN 0.4 MG SL SUBL
0.8000 mg | SUBLINGUAL_TABLET | Freq: Once | SUBLINGUAL | Status: AC
  Administered 2022-12-25: 0.8 mg via SUBLINGUAL

## 2023-07-01 IMAGING — US US EXTREM LOW VENOUS*L*
1 series · 13 of 24 positions shown · non-contrast
Comparison: 07/26/2020 and 04/11/2020

CLINICAL DATA: Pain and stiffness of left calf and history prior
left peroneal and popliteal DVT in 7474.



[Series 1: us extrem low venous*left* · 0.07mm/px · 13 of 41 slices shown]
[im 1/41]
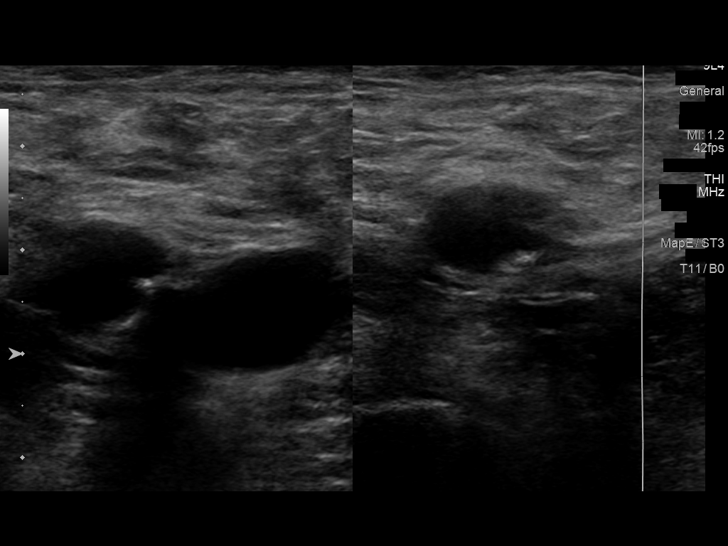
[im 4/41]
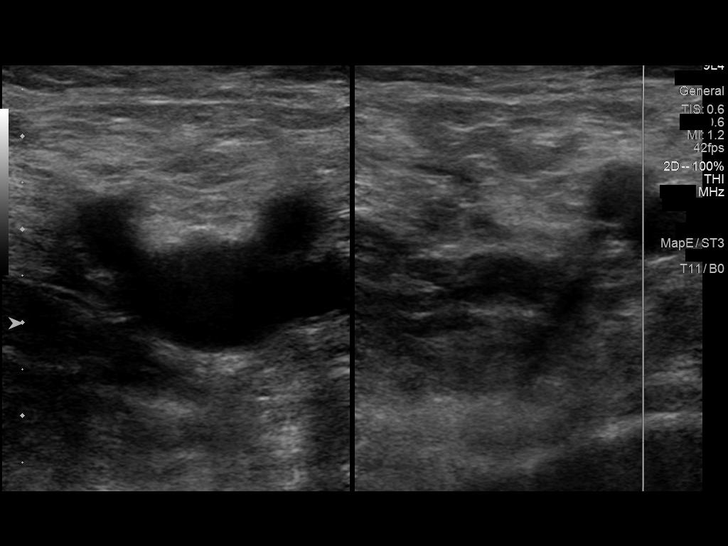
[im 7/41]
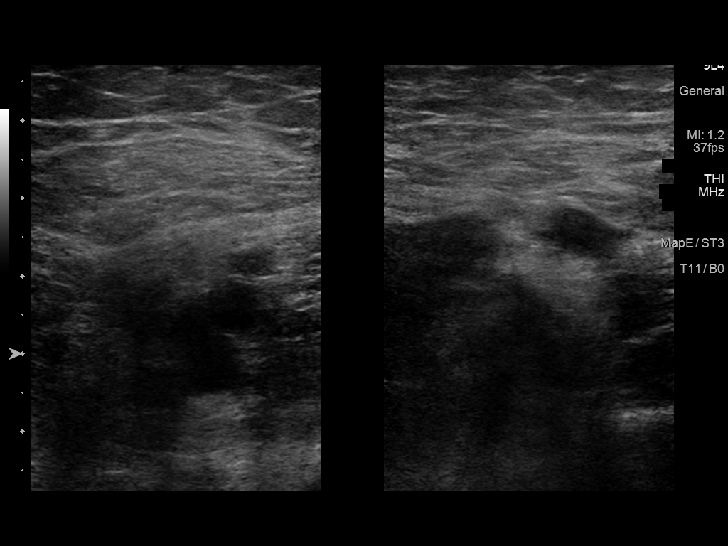
[im 11/41]
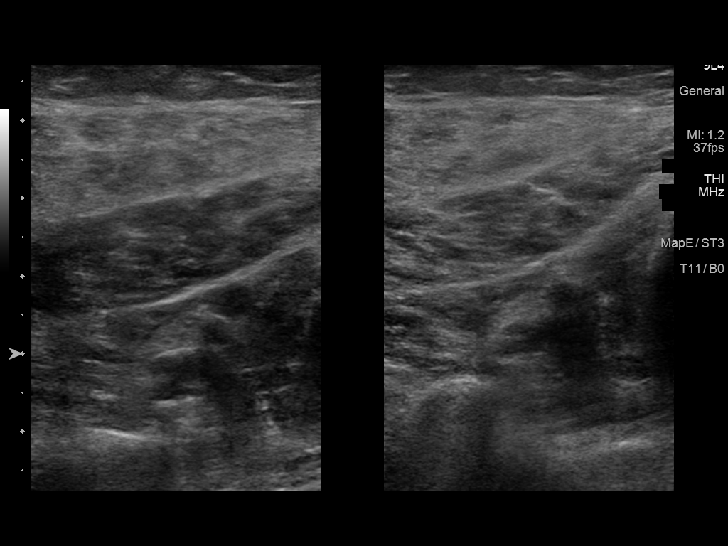
[im 14/41]
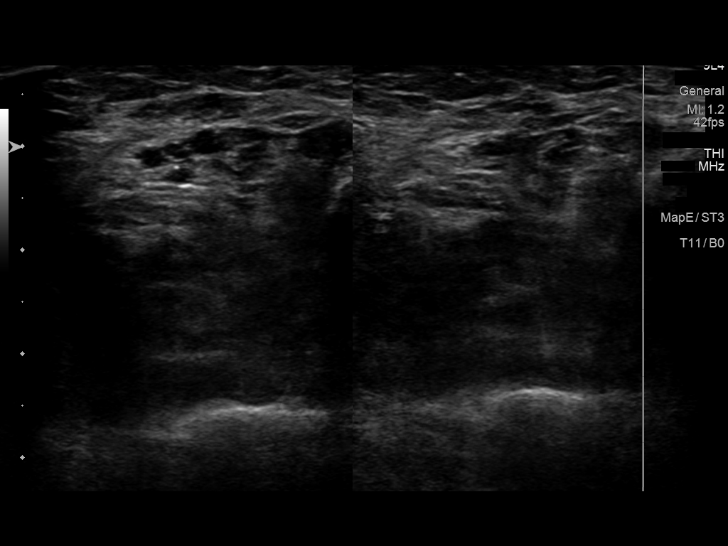
[im 18/41]
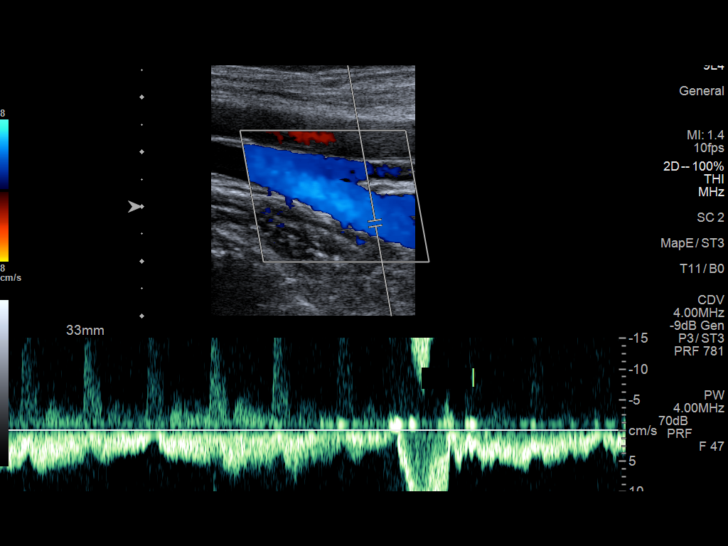
[im 21/41]
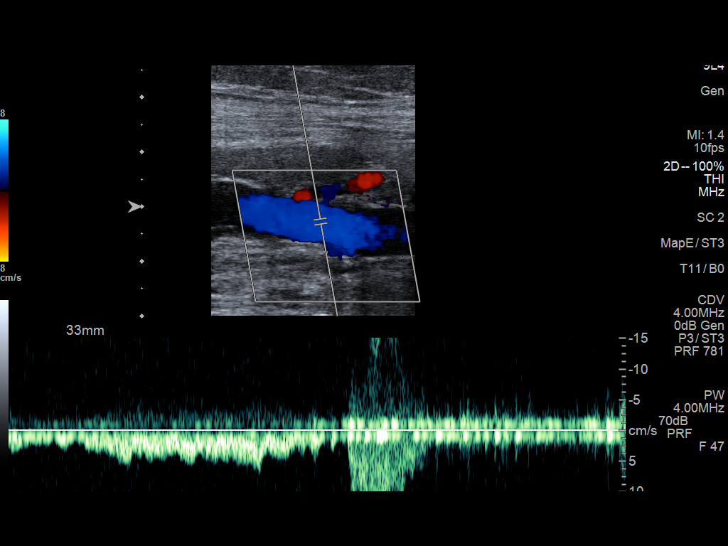
[im 23/41]
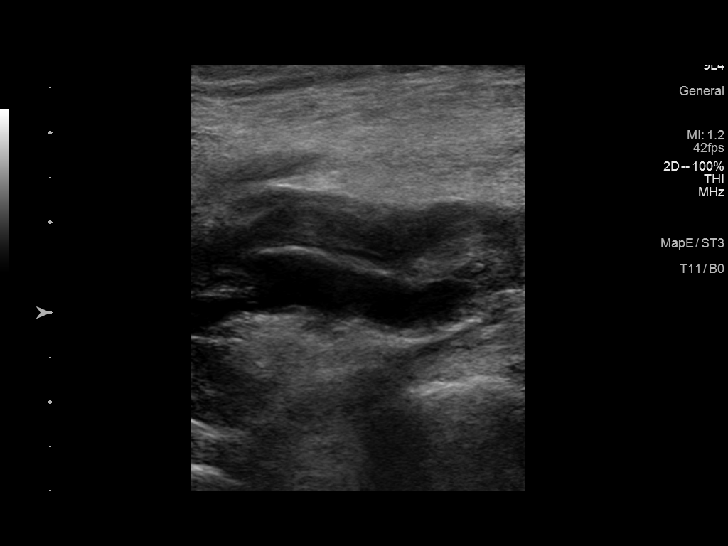
[im 27/41]
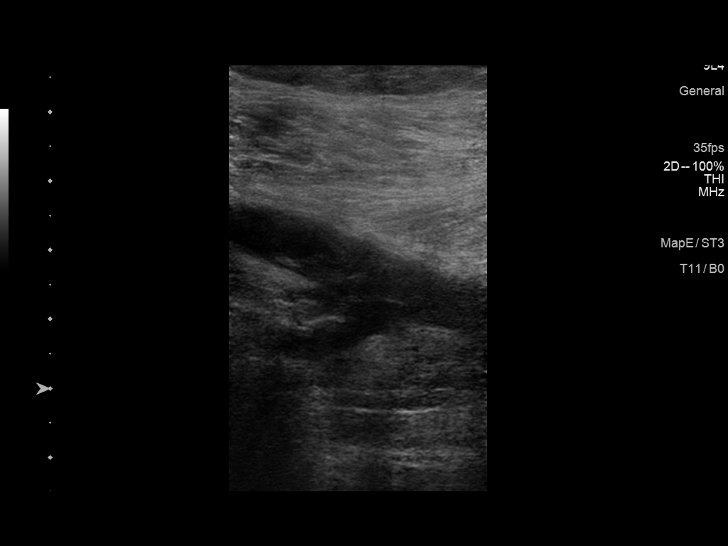
[im 30/41]
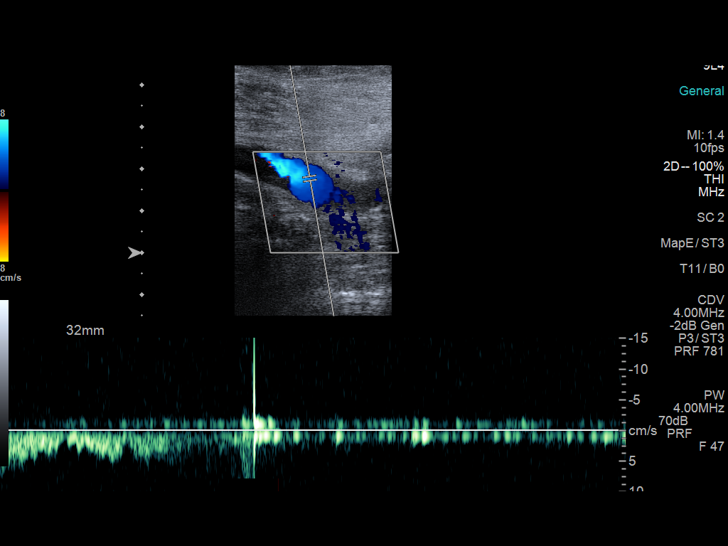
[im 34/41]
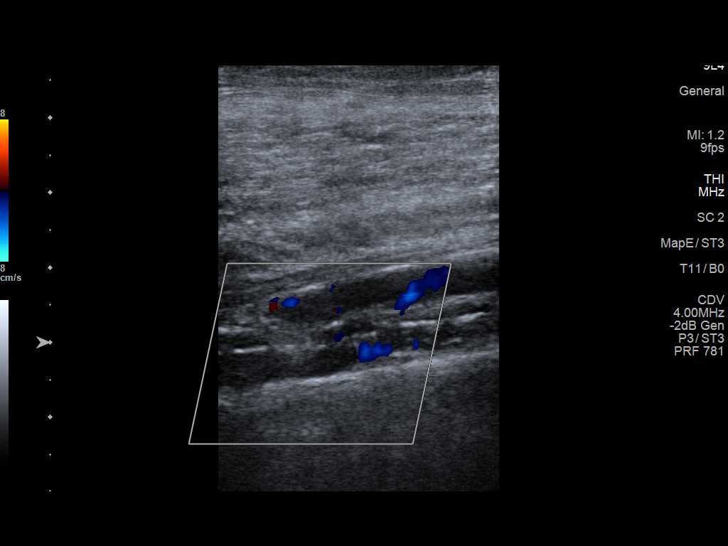
[im 37/41]
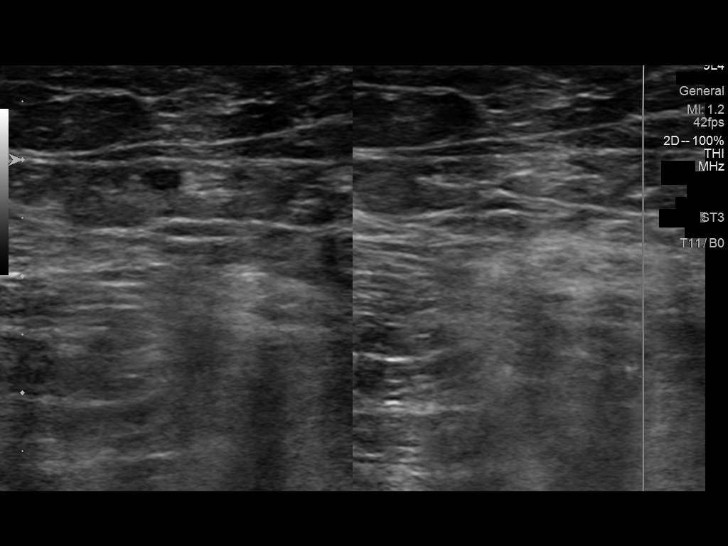
[im 41/41]
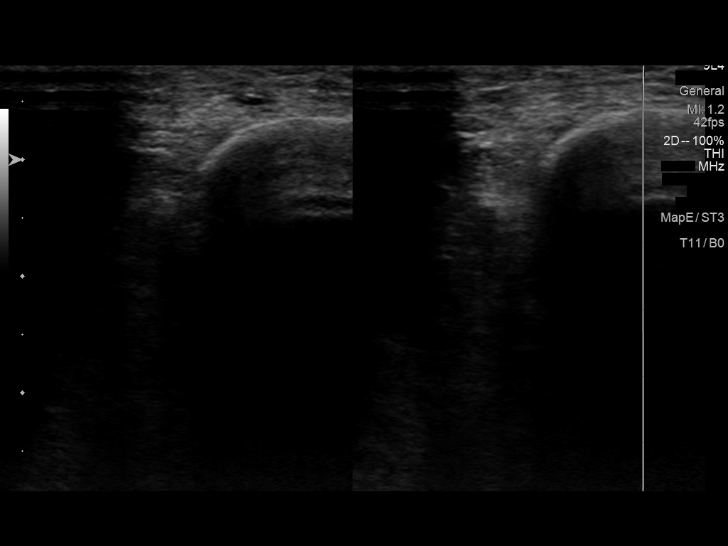

[13 of 24 positions shown; findings below may reference images not displayed]

FINDINGS: Contralateral Common Femoral Vein: Respiratory phasicity is normal
and symmetric with the symptomatic side. No evidence of thrombus.
Normal compressibility.

Common Femoral Vein: No evidence of thrombus. Normal
compressibility, respiratory phasicity and response to augmentation.

Saphenofemoral Junction: No evidence of thrombus. Normal
compressibility and flow on color Doppler imaging.

Profunda Femoral Vein: No evidence of thrombus. Normal
compressibility and flow on color Doppler imaging.

Femoral Vein: No evidence of thrombus. Normal compressibility,
respiratory phasicity and response to augmentation.

Popliteal Vein: Distal aspect of the popliteal vein demonstrates
some nonocclusive thrombus. The rest of the popliteal vein is
normally patent.

Calf Veins: Thrombus is present in the tibioperoneal trunk that
extends into the left peroneal vein. Some of the thrombus in the
left peroneal vein appears chronic and calcified.

Superficial Great Saphenous Vein: No evidence of thrombus. Normal
compressibility.

Venous Reflux:  None.

Other Findings: No evidence of superficial thrombophlebitis or
abnormal fluid collection.
IMPRESSION: Component of acute nonocclusive thrombus in the distal popliteal
vein and tibioperoneal trunk is suspected superimposed on a
component of chronic thrombus in the left peroneal vein which is
partially calcified.

## 2024-03-15 ENCOUNTER — Other Ambulatory Visit: Payer: Self-pay | Admitting: Family Medicine

## 2024-03-15 DIAGNOSIS — I2510 Atherosclerotic heart disease of native coronary artery without angina pectoris: Secondary | ICD-10-CM

## 2024-03-15 NOTE — Progress Notes (Signed)
 Known CAD.  Repeat CAC to determine change after being on statin for past year

## 2024-03-31 ENCOUNTER — Ambulatory Visit (HOSPITAL_BASED_OUTPATIENT_CLINIC_OR_DEPARTMENT_OTHER)
Admission: RE | Admit: 2024-03-31 | Discharge: 2024-03-31 | Disposition: A | Discharge status: Home / Self Care | Payer: Self-pay | Source: Ambulatory Visit | Attending: Family Medicine | Admitting: Family Medicine

## 2024-03-31 DIAGNOSIS — I2510 Atherosclerotic heart disease of native coronary artery without angina pectoris: Secondary | ICD-10-CM | POA: Insufficient documentation
